# Patient Record
Sex: Female | Born: 1950
Health system: Southern US, Community
[De-identification: ages and names within clinical notes are randomized; demographics above are authoritative.]

## PROBLEM LIST (undated history)

## (undated) DIAGNOSIS — M8580 Other specified disorders of bone density and structure, unspecified site: Secondary | ICD-10-CM

## (undated) DIAGNOSIS — M7051 Other bursitis of knee, right knee: Secondary | ICD-10-CM

## (undated) DIAGNOSIS — E669 Obesity, unspecified: Secondary | ICD-10-CM

## (undated) DIAGNOSIS — I10 Essential (primary) hypertension: Secondary | ICD-10-CM

## (undated) HISTORY — PX: CHOLECYSTECTOMY: SHX55

## (undated) HISTORY — PX: ABDOMINAL HYSTERECTOMY: SHX81

---

## 1997-10-01 ENCOUNTER — Ambulatory Visit (HOSPITAL_COMMUNITY): Admission: RE | Admit: 1997-10-01 | Discharge: 1997-10-01 | Payer: Self-pay | Admitting: Family Medicine

## 1997-10-02 ENCOUNTER — Ambulatory Visit (HOSPITAL_COMMUNITY): Admission: RE | Admit: 1997-10-02 | Discharge: 1997-10-02 | Payer: Self-pay

## 1997-11-04 ENCOUNTER — Ambulatory Visit (HOSPITAL_COMMUNITY): Admission: RE | Admit: 1997-11-04 | Discharge: 1997-11-04 | Payer: Self-pay | Admitting: Urology

## 2000-09-06 ENCOUNTER — Encounter: Admission: RE | Admit: 2000-09-06 | Discharge: 2000-09-06 | Payer: Self-pay | Admitting: Internal Medicine

## 2000-09-06 ENCOUNTER — Encounter: Payer: Self-pay | Admitting: Internal Medicine

## 2000-10-18 ENCOUNTER — Other Ambulatory Visit: Admission: RE | Admit: 2000-10-18 | Discharge: 2000-10-18 | Payer: Self-pay | Admitting: Obstetrics & Gynecology

## 2000-10-18 ENCOUNTER — Encounter: Admission: RE | Admit: 2000-10-18 | Discharge: 2000-10-18 | Payer: Self-pay | Admitting: Obstetrics & Gynecology

## 2000-10-27 ENCOUNTER — Ambulatory Visit (HOSPITAL_COMMUNITY): Admission: RE | Admit: 2000-10-27 | Discharge: 2000-10-27 | Payer: Self-pay | Admitting: *Deleted

## 2001-06-15 ENCOUNTER — Encounter: Payer: Self-pay | Admitting: Family Medicine

## 2001-06-15 ENCOUNTER — Encounter: Admission: RE | Admit: 2001-06-15 | Discharge: 2001-06-15 | Payer: Self-pay | Admitting: Family Medicine

## 2002-12-25 ENCOUNTER — Other Ambulatory Visit: Admission: RE | Admit: 2002-12-25 | Discharge: 2002-12-25 | Payer: Self-pay | Admitting: Obstetrics and Gynecology

## 2003-01-01 ENCOUNTER — Inpatient Hospital Stay (HOSPITAL_COMMUNITY): Admission: RE | Admit: 2003-01-01 | Discharge: 2003-01-03 | Payer: Self-pay | Admitting: Obstetrics and Gynecology

## 2003-01-01 ENCOUNTER — Encounter (INDEPENDENT_AMBULATORY_CARE_PROVIDER_SITE_OTHER): Payer: Self-pay | Admitting: Specialist

## 2003-01-15 ENCOUNTER — Encounter: Admission: RE | Admit: 2003-01-15 | Discharge: 2003-01-15 | Payer: Self-pay | Admitting: Obstetrics and Gynecology

## 2003-01-31 ENCOUNTER — Encounter: Admission: RE | Admit: 2003-01-31 | Discharge: 2003-01-31 | Payer: Self-pay | Admitting: Obstetrics and Gynecology

## 2007-07-07 ENCOUNTER — Encounter: Admission: RE | Admit: 2007-07-07 | Discharge: 2007-07-07 | Payer: Self-pay | Admitting: Family Medicine

## 2010-02-02 ENCOUNTER — Ambulatory Visit: Payer: Self-pay | Admitting: Internal Medicine

## 2010-03-15 ENCOUNTER — Encounter: Payer: Self-pay | Admitting: Family Medicine

## 2010-03-26 NOTE — Assessment & Plan Note (Signed)
Summary: FLU SHOT/EVM  Nurse Visit   Immunizations Administered:  Influenza Vaccine:    Vaccine Type: Flulaval    Site: left deltoid    Mfr: GlaxoSmithKline    Dose: 0.5 ml    Route: IM    Given by: Levonne Spiller EMT-P    Exp. Date: 08/22/2010    Lot #: [id number]    VIS given: 09/16/09 version given February 02, 2010.    Immunizations Administered:  Influenza Vaccine:    Vaccine Type: Flulaval    Site: left deltoid    Mfr: GlaxoSmithKline    Dose: 0.5 ml    Route: IM    Given by: Levonne Spiller EMT-P    Exp. Date: 08/22/2010    Lot #: [id number]    VIS given: 09/16/09 version given February 02, 2010.  Flu Vaccine Consent Questions:    Do you have a history of severe allergic reactions to this vaccine? no    Any prior history of allergic reactions to egg and/or gelatin? no    Do you have a sensitivity to the preservative Thimersol? no    Do you have a past history of Guillan-Barre Syndrome? no    Do you currently have an acute febrile illness? no    Have you ever had a severe reaction to latex? no    Vaccine information given and explained to patient? yes    Are you currently pregnant? no

## 2010-07-10 NOTE — Op Note (Signed)
NAME:  Julie Stout, Julie Stout                     ACCOUNT NO.:  [id number]   MEDICAL RECORD NO.:  [id number]                   PATIENT TYPE:  INP   LOCATION:  9199                                 FACILITY:  [facility]   PHYSICIAN:  Laqueta Linden, M.D.                 DATE OF BIRTH:  10/04/1950   DATE OF PROCEDURE:  01/01/2003  DATE OF DISCHARGE:                                 OPERATIVE REPORT   PREOPERATIVE DIAGNOSIS:  Large symptomatic leiomyomata uteri.   POSTOPERATIVE DIAGNOSIS:  Large symptomatic leiomyomata uteri.   PROCEDURES:  1. Total abdominal hysterectomy, bilateral salpingo-oophorectomy.  2. Placement of On-Q catheters x2.   SURGEON:  Laqueta Linden, M.D.   ASSISTANT:  Andres Ege, M.D.   ANESTHESIA:  General endotracheal.   ESTIMATED BLOOD LOSS:  200 mL.   URINE OUTPUT:  225 mL.   FLUIDS REPLACED:  2 L crystalloid.   COUNTS:  Correct x2.   COMPLICATIONS:  None.   INDICATIONS:  Julie Stout is a 60 year old gravida 2, para 2,  perimenopausal female with irregular and heavy periods and gradually  enlarging, increasingly symptomatic fibroids, who desires definitive  surgical management.  She is noted to have an 18-20 week size uterus on  examination.  Ultrasound confirms presence of large fibroids with a septate  cyst involving the right ovary.  Endometrial biopsy was benign.  She saw the  informed consent film, voiced her understanding and acceptance of all risks,  including but not limited to anesthesia risk, infection, bleeding possibly  requiring transfusion, injury to bowel, bladder, ureters, vessels, nerves,  possibility of fistula formation, other postoperative complications  including DVT, PE, pneumonia, death, and other unnamed risks, as well as  menopausal symptoms resulting from removal of her ovaries and management  thereof, as well as postoperative return to normal activity and sexual  functioning.  She voiced her understanding and acceptance of  all risks, had  her questions answered, and agrees to proceed.  She has received Ancef 1 g  IV antibiotic prophylaxis preoperatively.   DESCRIPTION OF PROCEDURE:  The patient was taken to the operating room and  after proper identification and consents were ascertained, she was placed on  the operating table in the supine position.  Anesthesia had a fair bit of  trouble intubating this patient and finally used the fiberoptic scope to  pass the endotracheal tube.  Specifics will be detailed in a note per  anesthesia.  After the patient was adequately intubated and ventilated, she  was then placed in the frogleg position and the abdomen, perineum, and  vagina were prepped and draped in a routine sterile fashion.  A  transurethral Foley was placed.  A transverse incision was made just above  the symphysis and carried down to the level of the anterior rectus fascia.  The fascia was incised and the incision extended laterally superiorly and  inferiorly.  The rectus muscles were  separated, parietal peritoneum was  incised, and the incision was extended superiorly and inferiorly.  Palpation  of the upper abdomen revealed smooth renal contours bilaterally.  The  patient is status post cholecystectomy.  The appendix was normal in  appearance without evidence of adhesions or appendicoliths.  The uterus was  noted to be grossly distorted by multiple large fibroids.  The left tube and  ovary appeared normal.  The right ovary had a large benign-appearing cyst,  which actually ruptured during the manipulation of the uterus.  A self-  retaining retractor was placed.  Round ligaments were clamped, cut, and  suture ligated with 0 Vicryl suture.  Dissection was carried forward  anteriorly and posteriorly in the broad ligament.  The infundibulopelvic  ligaments were then isolated bilaterally with the course of the ureter deep  in the pelvis while out of the operative field.  The infundibulopelvic  ligaments  were then clamped, cut, and doubly ligated with a free tie and a  stitch of 0 Vicryl.  Uterine vessels were then skeletonized and curved  Heaney clamps placed across the uterine vessels perpendicularly at the level  of the internal os.  This was accomplished after the bladder was dissected  further off the lower uterine segment and cervix.  After the uterine vessels  were ligated, an additional small pedicle was taken at the top of the  cardinal ligament, and that ligature included the previous one such that the  uterine vessels were doubly ligated.  At this point the uterus with attached  tubes and ovaries was transected from the upper cervix to improve  visualization.  The specimen was passed off the table to go for final  sectioning.  The lap packs and the self-retaining retractor were then  readjusted with marked improvement in visualization of the lower pelvis.  Straight Heaney clamps were placed across the cardinal ligaments bilaterally  to the level of the upper vaginal angles.  Curved Heaney clamps were then  placed with the pedicles suture ligated and tagged, including the  uterosacral ligaments.  The cervix was then circumscribed with a scalpel and  passed off the table to go with the final specimen.  A Richardson angle  suture was placed at both vaginal angles with excellent hemostasis noted.  The remainder of the vaginal cuff was closed with figure-of-eight sutures of  0 Vicryl from front to back.  The uterosacral ligaments were plicated  posteriorly to decrease enterocele formation.  Copious lavage was then  accomplished.  All pedicles were hemostatic.  Several small bleeding points  were cauterized.  The bladder flap was hemostatic.  Both ureters were of  normal course and caliber and peristalsing normally at the conclusion of the  procedure.  All lap packs and the retractor were then removed.  Counts were correct prior to closure of the abdomen.  The parietal peritoneum was  closed  in a running fashion using 2-0 Vicryl suture.  The rectus muscles were  loosely reapproximated in the midline.  An On-Q catheter was then placed  from the left midquadrant into the subfascial plane and steri-stripped and  then with OpSite placed to hold it in place.  The fascia was then closed  from both lateral aspects to the midline using a running stitch of 0 Maxon.  This catheter was then loaded with 10 mL of 1% plain Xylocaine.  Subcutaneous hemostasis was ascertained.  A second On-Q catheter was then  placed through the right midquadrant into the subcutaneous tissues.  This  was also steri-stripped and OpSite was placed to hold it in place.  The skin  was then closed with a subcuticular suture of 4-0 Dexon using a Keith  needle.  Steri-Strips and pressure dressing were applied.  The subcutaneous  catheter was bolused with 5 mL of 1% plain lidocaine.  The catheters were  then hooked up to a bulb containing 0.5% plain Marcaine running at a rate of  2 mL/hr. per bulb.  The patient also received Toradol 30 mg IV and 30 mg IM  intraoperatively.  She was stable and extubated on transfer to the PACU.  She will  be observed and discharged to the floor per anesthesia.  They will speak  with the patient regarding the difficulty with the intubation.  She was  given one dose of Decadron to decrease laryngeal edema from the potential  trauma of the repeated intubation attempts.  The patient was stable on  transfer to the PACU.                                               Laqueta Linden, M.D.    LKS/MEDQ  D:  01/01/2003  T:  01/01/2003  Job:  [id number]   cc:   Duncan Dull, M.D.  [address]  [place]  [place] [address]  Fax: [phone]

## 2013-03-16 ENCOUNTER — Other Ambulatory Visit: Payer: Self-pay | Admitting: Family Medicine

## 2013-03-16 DIAGNOSIS — Z780 Asymptomatic menopausal state: Secondary | ICD-10-CM

## 2013-03-16 DIAGNOSIS — Z1231 Encounter for screening mammogram for malignant neoplasm of breast: Secondary | ICD-10-CM

## 2013-04-12 ENCOUNTER — Other Ambulatory Visit: Payer: Self-pay

## 2013-04-12 ENCOUNTER — Ambulatory Visit: Payer: Self-pay

## 2013-05-04 ENCOUNTER — Ambulatory Visit
Admission: RE | Admit: 2013-05-04 | Discharge: 2013-05-04 | Disposition: A | Discharge status: Home / Self Care | Payer: BC Managed Care – PPO | Source: Ambulatory Visit | Attending: Family Medicine | Admitting: Family Medicine

## 2013-05-04 DIAGNOSIS — Z780 Asymptomatic menopausal state: Secondary | ICD-10-CM

## 2013-05-04 DIAGNOSIS — Z1231 Encounter for screening mammogram for malignant neoplasm of breast: Secondary | ICD-10-CM

## 2014-06-26 ENCOUNTER — Other Ambulatory Visit: Payer: Self-pay

## 2014-06-26 DIAGNOSIS — Z1231 Encounter for screening mammogram for malignant neoplasm of breast: Secondary | ICD-10-CM

## 2014-07-05 ENCOUNTER — Ambulatory Visit
Admission: RE | Admit: 2014-07-05 | Discharge: 2014-07-05 | Disposition: A | Discharge status: Home / Self Care | Payer: BLUE CROSS/BLUE SHIELD | Source: Ambulatory Visit

## 2014-07-05 DIAGNOSIS — Z1231 Encounter for screening mammogram for malignant neoplasm of breast: Secondary | ICD-10-CM

## 2014-08-19 ENCOUNTER — Ambulatory Visit: Payer: BLUE CROSS/BLUE SHIELD | Admitting: Anesthesiology

## 2014-08-19 ENCOUNTER — Encounter
Admission: RE | Disposition: A | Discharge status: Home / Self Care | Payer: Self-pay | Source: Ambulatory Visit | Attending: Unknown Physician Specialty

## 2014-08-19 ENCOUNTER — Ambulatory Visit
Admission: RE | Admit: 2014-08-19 | Discharge: 2014-08-19 | Disposition: A | Discharge status: Home / Self Care | Payer: BLUE CROSS/BLUE SHIELD | Source: Ambulatory Visit | Attending: Unknown Physician Specialty | Admitting: Unknown Physician Specialty

## 2014-08-19 DIAGNOSIS — Z1211 Encounter for screening for malignant neoplasm of colon: Secondary | ICD-10-CM | POA: Insufficient documentation

## 2014-08-19 DIAGNOSIS — Z791 Long term (current) use of non-steroidal anti-inflammatories (NSAID): Secondary | ICD-10-CM | POA: Diagnosis not present

## 2014-08-19 DIAGNOSIS — K640 First degree hemorrhoids: Secondary | ICD-10-CM | POA: Diagnosis not present

## 2014-08-19 DIAGNOSIS — K5730 Diverticulosis of large intestine without perforation or abscess without bleeding: Secondary | ICD-10-CM | POA: Insufficient documentation

## 2014-08-19 DIAGNOSIS — D125 Benign neoplasm of sigmoid colon: Secondary | ICD-10-CM | POA: Insufficient documentation

## 2014-08-19 DIAGNOSIS — Z79899 Other long term (current) drug therapy: Secondary | ICD-10-CM | POA: Insufficient documentation

## 2014-08-19 HISTORY — PX: COLONOSCOPY WITH PROPOFOL: SHX5780

## 2014-08-19 SURGERY — COLONOSCOPY WITH PROPOFOL
Anesthesia: General

## 2014-08-19 MED ORDER — LIDOCAINE HCL (PF) 1 % IJ SOLN
INTRAMUSCULAR | Status: AC
  Administered 2014-08-19: 0.3 mL via INTRADERMAL
  Filled 2014-08-19: qty 2

## 2014-08-19 MED ORDER — SODIUM CHLORIDE 0.9 % IV SOLN
INTRAVENOUS | Status: DC
  Administered 2014-08-19: 1000 mL via INTRAVENOUS

## 2014-08-19 MED ORDER — PROPOFOL INFUSION 10 MG/ML OPTIME
INTRAVENOUS | Status: DC | PRN
  Administered 2014-08-19: 100 ug/kg/min via INTRAVENOUS

## 2014-08-19 MED ORDER — LIDOCAINE HCL (PF) 1 % IJ SOLN
2.0000 mL | Freq: Once | INTRAMUSCULAR | Status: AC
  Administered 2014-08-19: 0.3 mL via INTRADERMAL

## 2014-08-19 MED ORDER — LIDOCAINE HCL (PF) 2 % IJ SOLN
INTRAMUSCULAR | Status: DC | PRN
  Administered 2014-08-19: 50 mg

## 2014-08-19 MED ORDER — FENTANYL CITRATE (PF) 100 MCG/2ML IJ SOLN
INTRAMUSCULAR | Status: DC | PRN
  Administered 2014-08-19: 50 ug via INTRAVENOUS

## 2014-08-19 MED ORDER — PROPOFOL 10 MG/ML IV BOLUS
INTRAVENOUS | Status: DC | PRN
  Administered 2014-08-19: 10 mg via INTRAVENOUS
  Administered 2014-08-19: 50 mg via INTRAVENOUS

## 2014-08-19 MED ORDER — MIDAZOLAM HCL 5 MG/5ML IJ SOLN
INTRAMUSCULAR | Status: DC | PRN
  Administered 2014-08-19: 1 mg via INTRAVENOUS

## 2014-08-19 NOTE — Op Note (Signed)
[facility] Gastroenterology Patient Name: Julie Stout Procedure Date: 08/19/2014 1:26 PM MRN: [id number] Account #: [id number] Date of Birth: 1950-03-17 Admit Type: Outpatient Age: 64 Room: [facility] ENDO ROOM 1 Gender: Female Note Status: Finalized Procedure:         Colonoscopy Indications:       Screening for colorectal malignant neoplasm Providers:         Manya Silvas, MD Referring MD:      Darcus Austin, MD (Referring MD) Medicines:         Propofol per Anesthesia Complications:     No immediate complications. Procedure:         Pre-Anesthesia Assessment:                    - After reviewing the risks and benefits, the patient was                     deemed in satisfactory condition to undergo the procedure.                    After obtaining informed consent, the colonoscope was                     passed under direct vision. Throughout the procedure, the                     patient's blood pressure, pulse, and oxygen saturations                     were monitored continuously. The Olympus Colonoscope                     PCF-160AL (S# [id number]) was introduced through the anus and                     advanced to the the cecum, identified by appendiceal                     orifice and ileocecal valve. The colonoscopy was performed                     without difficulty. The patient tolerated the procedure                     well. The quality of the bowel preparation was excellent. Findings:      Many medium-mouthed diverticula were found in the sigmoid colon and in       the descending colon.      Internal hemorrhoids were found during endoscopy. The hemorrhoids were       small, medium-sized and Grade I (internal hemorrhoids that do not       prolapse). Anal papiliae seen.      A 10 mm polyp was found in the descending colon. The polyp was sessile.       The polyp was removed with a hot snare. Resection and retrieval were       complete.      The  exam was otherwise without abnormality. Impression:        - Diverticulosis in the sigmoid colon and in the                     descending colon.                    -  Internal hemorrhoids.                    - One 10 mm polyp in the descending colon. Resected and                     retrieved.                    - The examination was otherwise normal. Recommendation:    - Await pathology results. Manya Silvas, MD 08/19/2014 2:09:38 PM This report has been signed electronically. Number of Addenda: 0 Note Initiated On: 08/19/2014 1:26 PM Scope Withdrawal Time: 0 hours 19 minutes 56 seconds  Total Procedure Duration: 0 hours 30 minutes 20 seconds       [facility]

## 2014-08-19 NOTE — Anesthesia Preprocedure Evaluation (Signed)
Anesthesia Evaluation  Patient identified by MRN, date of birth, ID band Patient awake    Reviewed: Allergy & Precautions, H&P , NPO status , Patient's Chart, lab work & pertinent test results, reviewed documented beta blocker date and time   Airway Mallampati: II  TM Distance: >3 FB Neck ROM: full    Dental no notable dental hx.    Pulmonary neg pulmonary ROS,  breath sounds clear to auscultation  Pulmonary exam normal       Cardiovascular Exercise Tolerance: Good negative cardio ROS  Rhythm:regular Rate:Normal     Neuro/Psych negative neurological ROS  negative psych ROS   GI/Hepatic negative GI ROS, Neg liver ROS,   Endo/Other  negative endocrine ROS  Renal/GU negative Renal ROS  negative genitourinary   Musculoskeletal   Abdominal   Peds  Hematology negative hematology ROS (+)   Anesthesia Other Findings   Reproductive/Obstetrics negative OB ROS                             Anesthesia Physical Anesthesia Plan  ASA: II  Anesthesia Plan: General   Post-op Pain Management:    Induction:   Airway Management Planned:   Additional Equipment:   Intra-op Plan:   Post-operative Plan:   Informed Consent: I have reviewed the patients History and Physical, chart, labs and discussed the procedure including the risks, benefits and alternatives for the proposed anesthesia with the patient or authorized representative who has indicated his/her understanding and acceptance.   Dental Advisory Given  Plan Discussed with: CRNA  Anesthesia Plan Comments:         Anesthesia Quick Evaluation

## 2014-08-19 NOTE — H&P (Signed)
Primary Care Physician:  Marjorie Smolder, MD Primary Gastroenterologist:  Dr. Vira Agar  Pre-Procedure History & Physical: HPI:  Julie Stout is a 64 y.o. female is here for an colonoscopy.   No past medical history on file.  No past surgical history on file.  Prior to Admission medications   Medication Sig Start Date End Date Taking? Authorizing Provider  cholecalciferol (VITAMIN D) 400 UNITS TABS tablet Take 400 Units by mouth.   Yes Historical Provider, MD  ibuprofen (ADVIL,MOTRIN) 800 MG tablet Take 800 mg by mouth every 8 (eight) hours as needed.   Yes Historical Provider, MD  polyethylene glycol powder (GLYCOLAX/MIRALAX) powder Take as directed for colonoscopy prep. 07/04/14  Yes Historical Provider, MD  hydrochlorothiazide (MICROZIDE) 12.5 MG capsule Take by mouth.    Historical Provider, MD  lisinopril (PRINIVIL,ZESTRIL) 20 MG tablet Take by mouth.    Historical Provider, MD    Allergies as of 07/05/2014  . (Not on File)    No family history on file.  History   Social History  . Marital Status: Widowed    Spouse Name: N/A  . Number of Children: N/A  . Years of Education: N/A   Occupational History  . Not on file.   Social History Main Topics  . Smoking status: Not on file  . Smokeless tobacco: Not on file  . Alcohol Use: Not on file  . Drug Use: Not on file  . Sexual Activity: Not on file   Other Topics Concern  . Not on file   Social History Narrative  . No narrative on file    Review of Systems: See HPI, otherwise negative ROS  Physical Exam: BP 152/95 mmHg  Pulse 80  Temp(Src) 96.7 F (35.9 C) (Tympanic)  Resp 17  Ht 5\' 1"  (1.549 m)  Wt 90.719 kg (200 lb)  BMI 37.81 kg/m2  SpO2 100% General:   Alert,  pleasant and cooperative in NAD Head:  Normocephalic and atraumatic. Neck:  Supple; no masses or thyromegaly. Lungs:  Clear throughout to auscultation.    Heart:  Regular rate and rhythm. Abdomen:  Soft, nontender and nondistended.  Normal bowel sounds, without guarding, and without rebound.   Neurologic:  Alert and  oriented x4;  grossly normal neurologically.  Impression/Plan: Julie Stout is here for an colonoscopy to be performed for screening colonoscopy  Risks, benefits, limitations, and alternatives regarding  colonoscopy have been reviewed with the patient.  Questions have been answered.  All parties agreeable.   Julie Cheers, MD  08/19/2014, 2:11 PM   Primary Care Physician:  Marjorie Smolder, MD Primary Gastroenterologist:  Dr. Vira Agar  Pre-Procedure History & Physical: HPI:  Julie Stout is a 64 y.o. female is here for an colonoscopy.   No past medical history on file.  No past surgical history on file.  Prior to Admission medications   Medication Sig Start Date End Date Taking? Authorizing Provider  cholecalciferol (VITAMIN D) 400 UNITS TABS tablet Take 400 Units by mouth.   Yes Historical Provider, MD  ibuprofen (ADVIL,MOTRIN) 800 MG tablet Take 800 mg by mouth every 8 (eight) hours as needed.   Yes Historical Provider, MD  polyethylene glycol powder (GLYCOLAX/MIRALAX) powder Take as directed for colonoscopy prep. 07/04/14  Yes Historical Provider, MD  hydrochlorothiazide (MICROZIDE) 12.5 MG capsule Take by mouth.    Historical Provider, MD  lisinopril (PRINIVIL,ZESTRIL) 20 MG tablet Take by mouth.    Historical Provider, MD    Allergies as of 07/05/2014  . (  Not on File)    No family history on file.  History   Social History  . Marital Status: Widowed    Spouse Name: N/A  . Number of Children: N/A  . Years of Education: N/A   Occupational History  . Not on file.   Social History Main Topics  . Smoking status: Not on file  . Smokeless tobacco: Not on file  . Alcohol Use: Not on file  . Drug Use: Not on file  . Sexual Activity: Not on file   Other Topics Concern  . Not on file   Social History Narrative  . No narrative on file    Review of Systems: See  HPI, otherwise negative ROS  Physical Exam: BP 152/95 mmHg  Pulse 80  Temp(Src) 96.7 F (35.9 C) (Tympanic)  Resp 17  Ht 5\' 1"  (1.549 m)  Wt 90.719 kg (200 lb)  BMI 37.81 kg/m2  SpO2 100% General:   Alert,  pleasant and cooperative in NAD Head:  Normocephalic and atraumatic. Neck:  Supple; no masses or thyromegaly. Lungs:  Clear throughout to auscultation.    Heart:  Regular rate and rhythm. Abdomen:  Soft, nontender and nondistended. Normal bowel sounds, without guarding, and without rebound.   Neurologic:  Alert and  oriented x4;  grossly normal neurologically.  Impression/Plan: Julie Stout is here for an colonoscopy to be performed for screening colon  Risks, benefits, limitations, and alternatives regarding  colonoscopy have been reviewed with the patient.  Questions have been answered.  All parties agreeable.   Julie Cheers, MD  08/19/2014, 2:11 PM

## 2014-08-19 NOTE — Transfer of Care (Signed)
Immediate Anesthesia Transfer of Care Note  Patient: Julie Stout  Procedure(s) Performed: Procedure(s): COLONOSCOPY WITH PROPOFOL (N/A)  Patient Location: PACU  Anesthesia Type:General  Level of Consciousness: awake  Airway & Oxygen Therapy: Patient Spontanous Breathing and Patient connected to nasal cannula oxygen  Post-op Assessment: Report given to RN and Post -op Vital signs reviewed and stable  Post vital signs: Reviewed and stable  Last Vitals:  Filed Vitals:   08/19/14 1308  BP: 152/95  Pulse: 80  Temp: 35.9 C  Resp: 17    Complications: No apparent anesthesia complications

## 2014-08-20 NOTE — Anesthesia Postprocedure Evaluation (Signed)
  Anesthesia Post-op Note  Patient: Julie Stout  Procedure(s) Performed: Procedure(s): COLONOSCOPY WITH PROPOFOL (N/A)  Anesthesia type:General  Patient location: PACU  Post pain: Pain level controlled  Post assessment: Post-op Vital signs reviewed, Patient's Cardiovascular Status Stable, Respiratory Function Stable, Patent Airway and No signs of Nausea or vomiting  Post vital signs: Reviewed and stable  Last Vitals:  Filed Vitals:   08/19/14 1450  BP: 99/51  Pulse: 72  Temp:   Resp: 13    Level of consciousness: awake, alert  and patient cooperative  Complications: No apparent anesthesia complications

## 2014-08-21 ENCOUNTER — Encounter: Payer: Self-pay | Admitting: Unknown Physician Specialty

## 2014-08-21 LAB — SURGICAL PATHOLOGY

## 2015-05-19 DIAGNOSIS — E559 Vitamin D deficiency, unspecified: Secondary | ICD-10-CM | POA: Diagnosis not present

## 2015-05-19 DIAGNOSIS — Z23 Encounter for immunization: Secondary | ICD-10-CM | POA: Diagnosis not present

## 2015-05-19 DIAGNOSIS — E7800 Pure hypercholesterolemia, unspecified: Secondary | ICD-10-CM | POA: Diagnosis not present

## 2015-05-19 DIAGNOSIS — R739 Hyperglycemia, unspecified: Secondary | ICD-10-CM | POA: Diagnosis not present

## 2015-05-19 DIAGNOSIS — I10 Essential (primary) hypertension: Secondary | ICD-10-CM | POA: Diagnosis not present

## 2015-05-19 DIAGNOSIS — Z0000 Encounter for general adult medical examination without abnormal findings: Secondary | ICD-10-CM | POA: Diagnosis not present

## 2015-06-24 DIAGNOSIS — E876 Hypokalemia: Secondary | ICD-10-CM | POA: Diagnosis not present

## 2015-08-25 DIAGNOSIS — E669 Obesity, unspecified: Secondary | ICD-10-CM | POA: Diagnosis not present

## 2015-08-25 DIAGNOSIS — R7303 Prediabetes: Secondary | ICD-10-CM | POA: Diagnosis not present

## 2015-11-20 DIAGNOSIS — R7303 Prediabetes: Secondary | ICD-10-CM | POA: Diagnosis not present

## 2015-11-20 DIAGNOSIS — Z23 Encounter for immunization: Secondary | ICD-10-CM | POA: Diagnosis not present

## 2015-11-20 DIAGNOSIS — I10 Essential (primary) hypertension: Secondary | ICD-10-CM | POA: Diagnosis not present

## 2016-05-31 DIAGNOSIS — Z0000 Encounter for general adult medical examination without abnormal findings: Secondary | ICD-10-CM | POA: Diagnosis not present

## 2016-05-31 DIAGNOSIS — I10 Essential (primary) hypertension: Secondary | ICD-10-CM | POA: Diagnosis not present

## 2016-05-31 DIAGNOSIS — E7800 Pure hypercholesterolemia, unspecified: Secondary | ICD-10-CM | POA: Diagnosis not present

## 2016-05-31 DIAGNOSIS — R7303 Prediabetes: Secondary | ICD-10-CM | POA: Diagnosis not present

## 2016-05-31 DIAGNOSIS — E559 Vitamin D deficiency, unspecified: Secondary | ICD-10-CM | POA: Diagnosis not present

## 2017-07-11 DIAGNOSIS — M8580 Other specified disorders of bone density and structure, unspecified site: Secondary | ICD-10-CM | POA: Diagnosis not present

## 2017-07-11 DIAGNOSIS — E559 Vitamin D deficiency, unspecified: Secondary | ICD-10-CM | POA: Diagnosis not present

## 2017-07-11 DIAGNOSIS — E7800 Pure hypercholesterolemia, unspecified: Secondary | ICD-10-CM | POA: Diagnosis not present

## 2017-07-11 DIAGNOSIS — Z0000 Encounter for general adult medical examination without abnormal findings: Secondary | ICD-10-CM | POA: Diagnosis not present

## 2017-07-11 DIAGNOSIS — R7309 Other abnormal glucose: Secondary | ICD-10-CM | POA: Diagnosis not present

## 2017-07-11 DIAGNOSIS — I10 Essential (primary) hypertension: Secondary | ICD-10-CM | POA: Diagnosis not present

## 2017-07-11 DIAGNOSIS — Z1159 Encounter for screening for other viral diseases: Secondary | ICD-10-CM | POA: Diagnosis not present

## 2017-07-12 ENCOUNTER — Other Ambulatory Visit: Payer: Self-pay | Admitting: Family Medicine

## 2017-07-12 DIAGNOSIS — M8580 Other specified disorders of bone density and structure, unspecified site: Secondary | ICD-10-CM

## 2017-07-12 DIAGNOSIS — Z139 Encounter for screening, unspecified: Secondary | ICD-10-CM

## 2017-07-19 ENCOUNTER — Other Ambulatory Visit: Payer: Self-pay | Admitting: Family Medicine

## 2017-11-17 DIAGNOSIS — Z86010 Personal history of colonic polyps: Secondary | ICD-10-CM | POA: Diagnosis not present

## 2018-01-18 DIAGNOSIS — I10 Essential (primary) hypertension: Secondary | ICD-10-CM | POA: Diagnosis not present

## 2018-01-18 DIAGNOSIS — E7800 Pure hypercholesterolemia, unspecified: Secondary | ICD-10-CM | POA: Diagnosis not present

## 2018-01-18 DIAGNOSIS — Z6837 Body mass index (BMI) 37.0-37.9, adult: Secondary | ICD-10-CM | POA: Diagnosis not present

## 2018-01-18 DIAGNOSIS — N183 Chronic kidney disease, stage 3 (moderate): Secondary | ICD-10-CM | POA: Diagnosis not present

## 2018-01-18 DIAGNOSIS — R7303 Prediabetes: Secondary | ICD-10-CM | POA: Diagnosis not present

## 2018-01-24 ENCOUNTER — Ambulatory Visit
Admission: RE | Admit: 2018-01-24 | Discharge: 2018-01-24 | Disposition: A | Discharge status: Home / Self Care | Payer: PPO | Source: Ambulatory Visit | Attending: Family Medicine | Admitting: Family Medicine

## 2018-01-24 DIAGNOSIS — Z139 Encounter for screening, unspecified: Secondary | ICD-10-CM

## 2018-01-24 DIAGNOSIS — Z1231 Encounter for screening mammogram for malignant neoplasm of breast: Secondary | ICD-10-CM | POA: Diagnosis not present

## 2018-01-31 ENCOUNTER — Encounter: Payer: Self-pay | Admitting: *Deleted

## 2018-02-01 ENCOUNTER — Encounter: Payer: Self-pay | Admitting: *Deleted

## 2018-02-01 ENCOUNTER — Ambulatory Visit: Payer: PPO | Admitting: Anesthesiology

## 2018-02-01 ENCOUNTER — Ambulatory Visit
Admission: RE | Admit: 2018-02-01 | Discharge: 2018-02-01 | Disposition: A | Discharge status: Home / Self Care | Payer: PPO | Attending: Unknown Physician Specialty | Admitting: Unknown Physician Specialty

## 2018-02-01 ENCOUNTER — Encounter
Admission: RE | Disposition: A | Discharge status: Home / Self Care | Payer: Self-pay | Source: Home / Self Care | Attending: Unknown Physician Specialty

## 2018-02-01 DIAGNOSIS — D128 Benign neoplasm of rectum: Secondary | ICD-10-CM | POA: Insufficient documentation

## 2018-02-01 DIAGNOSIS — K640 First degree hemorrhoids: Secondary | ICD-10-CM | POA: Diagnosis not present

## 2018-02-01 DIAGNOSIS — K5790 Diverticulosis of intestine, part unspecified, without perforation or abscess without bleeding: Secondary | ICD-10-CM | POA: Diagnosis not present

## 2018-02-01 DIAGNOSIS — Z1211 Encounter for screening for malignant neoplasm of colon: Secondary | ICD-10-CM | POA: Insufficient documentation

## 2018-02-01 DIAGNOSIS — K5730 Diverticulosis of large intestine without perforation or abscess without bleeding: Secondary | ICD-10-CM | POA: Diagnosis not present

## 2018-02-01 DIAGNOSIS — M8580 Other specified disorders of bone density and structure, unspecified site: Secondary | ICD-10-CM | POA: Diagnosis not present

## 2018-02-01 DIAGNOSIS — I10 Essential (primary) hypertension: Secondary | ICD-10-CM | POA: Diagnosis not present

## 2018-02-01 DIAGNOSIS — D126 Benign neoplasm of colon, unspecified: Secondary | ICD-10-CM | POA: Diagnosis not present

## 2018-02-01 DIAGNOSIS — K648 Other hemorrhoids: Secondary | ICD-10-CM | POA: Diagnosis not present

## 2018-02-01 DIAGNOSIS — Z79899 Other long term (current) drug therapy: Secondary | ICD-10-CM | POA: Insufficient documentation

## 2018-02-01 DIAGNOSIS — E669 Obesity, unspecified: Secondary | ICD-10-CM | POA: Diagnosis not present

## 2018-02-01 DIAGNOSIS — K635 Polyp of colon: Secondary | ICD-10-CM | POA: Diagnosis not present

## 2018-02-01 DIAGNOSIS — Z6834 Body mass index (BMI) 34.0-34.9, adult: Secondary | ICD-10-CM | POA: Diagnosis not present

## 2018-02-01 DIAGNOSIS — Z86010 Personal history of colonic polyps: Secondary | ICD-10-CM | POA: Diagnosis not present

## 2018-02-01 HISTORY — DX: Other bursitis of knee, right knee: M70.51

## 2018-02-01 HISTORY — DX: Essential (primary) hypertension: I10

## 2018-02-01 HISTORY — DX: Other specified disorders of bone density and structure, unspecified site: M85.80

## 2018-02-01 HISTORY — PX: COLONOSCOPY WITH PROPOFOL: SHX5780

## 2018-02-01 HISTORY — DX: Obesity, unspecified: E66.9

## 2018-02-01 SURGERY — COLONOSCOPY WITH PROPOFOL
Anesthesia: General

## 2018-02-01 MED ORDER — EPHEDRINE SULFATE 50 MG/ML IJ SOLN
INTRAMUSCULAR | Status: DC | PRN
  Administered 2018-02-01: 10 mg via INTRAVENOUS

## 2018-02-01 MED ORDER — PROPOFOL 10 MG/ML IV BOLUS
INTRAVENOUS | Status: DC | PRN
  Administered 2018-02-01: 80 mg via INTRAVENOUS

## 2018-02-01 MED ORDER — PHENYLEPHRINE HCL 10 MG/ML IJ SOLN
INTRAMUSCULAR | Status: DC | PRN
  Administered 2018-02-01: 100 ug via INTRAVENOUS

## 2018-02-01 MED ORDER — LIDOCAINE 2% (20 MG/ML) 5 ML SYRINGE
INTRAMUSCULAR | Status: DC | PRN
  Administered 2018-02-01: 30 mg via INTRAVENOUS

## 2018-02-01 MED ORDER — PROPOFOL 500 MG/50ML IV EMUL
INTRAVENOUS | Status: AC
  Filled 2018-02-01: qty 50

## 2018-02-01 MED ORDER — SODIUM CHLORIDE 0.9 % IV SOLN
INTRAVENOUS | Status: DC
  Administered 2018-02-01: 12:00:00 via INTRAVENOUS

## 2018-02-01 MED ORDER — FENTANYL CITRATE (PF) 100 MCG/2ML IJ SOLN
INTRAMUSCULAR | Status: AC
  Filled 2018-02-01: qty 2

## 2018-02-01 MED ORDER — SODIUM CHLORIDE 0.9 % IV SOLN: INTRAVENOUS | Status: DC

## 2018-02-01 MED ORDER — PROPOFOL 500 MG/50ML IV EMUL
INTRAVENOUS | Status: DC | PRN
  Administered 2018-02-01: 160 ug/kg/min via INTRAVENOUS

## 2018-02-01 MED ORDER — FENTANYL CITRATE (PF) 100 MCG/2ML IJ SOLN
INTRAMUSCULAR | Status: DC | PRN
  Administered 2018-02-01 (×2): 50 ug via INTRAVENOUS

## 2018-02-01 NOTE — Op Note (Signed)
[facility] Gastroenterology Patient Name: Julie Stout Procedure Date: 02/01/2018 12:47 PM MRN: [id number] Account #: [id number] Date of Birth: [birth year] Admit Type: Outpatient Age: 67 Room: [facility] ENDO ROOM 2 Gender: Female Note Status: Finalized Procedure:            Colonoscopy Indications:          High risk colon cancer surveillance: Personal history                        of colonic polyps Providers:            Manya Silvas, MD Referring MD:         Darcus Austin (Referring MD) Medicines:            Propofol per Anesthesia Complications:        No immediate complications. Procedure:            Pre-Anesthesia Assessment:                       - After reviewing the risks and benefits, the patient                        was deemed in satisfactory condition to undergo the                        procedure.                       After obtaining informed consent, the colonoscope was                        passed under direct vision. Throughout the procedure,                        the patient's blood pressure, pulse, and oxygen                        saturations were monitored continuously. The                        Colonoscope was introduced through the anus and                        advanced to the the cecum, identified by appendiceal                        orifice and ileocecal valve. The colonoscopy was                        performed without difficulty. The patient tolerated the                        procedure well. The quality of the bowel preparation                        was excellent. Findings:      A diminutive polyp was found in the rectum. The polyp was sessile. The       polyp was removed with a jumbo cold forceps. Resection and retrieval       were complete.      Multiple small-mouthed diverticula were found in the  sigmoid colon and       descending colon.      Internal hemorrhoids were found during endoscopy. The hemorrhoids were      small and Grade I (internal hemorrhoids that do not prolapse).      The exam was otherwise without abnormality. Impression:           - One diminutive polyp in the rectum, removed with a                        jumbo cold forceps. Resected and retrieved.                       - Diverticulosis in the sigmoid colon and in the                        descending colon.                       - Internal hemorrhoids.                       - The examination was otherwise normal. Recommendation:       - Await pathology results. Manya Silvas, MD 02/01/2018 1:21:49 PM This report has been signed electronically. Number of Addenda: 0 Note Initiated On: 02/01/2018 12:47 PM Scope Withdrawal Time: 0 hours 11 minutes 39 seconds  Total Procedure Duration: 0 hours 19 minutes 58 seconds       [facility]

## 2018-02-01 NOTE — H&P (Signed)
Primary Care Physician:  Darcus Austin, MD Primary Gastroenterologist:  Dr. Vira Agar  Pre-Procedure History & Physical: HPI:  Julie Stout is a 67 y.o. female is here for an colonoscopy.   Past Medical History:  Diagnosis Date  . Hypertension   . Obesity   . Osteopenia   . Pes anserinus bursitis of right knee     Past Surgical History:  Procedure Laterality Date  . ABDOMINAL HYSTERECTOMY    . CHOLECYSTECTOMY    . COLONOSCOPY WITH PROPOFOL N/A 08/19/2014   Procedure: COLONOSCOPY WITH PROPOFOL;  Surgeon: Manya Silvas, MD;  Location: [facility] ENDOSCOPY;  Service: Endoscopy;  Laterality: N/A;    Prior to Admission medications   Medication Sig Start Date End Date Taking? Authorizing Provider  hydrochlorothiazide (MICROZIDE) 12.5 MG capsule Take by mouth.   Yes [provider]  ibuprofen (ADVIL,MOTRIN) 800 MG tablet Take 800 mg by mouth every 8 (eight) hours as needed.   Yes [provider]  lisinopril (PRINIVIL,ZESTRIL) 20 MG tablet Take by mouth.   Yes [provider]  cholecalciferol (VITAMIN D) 400 UNITS TABS tablet Take 400 Units by mouth.    [provider]    Allergies as of 12/08/2017  . (No Known Allergies)    History reviewed. No pertinent family history.  Social History   Socioeconomic History  . Marital status: Widowed    Spouse name: Not on file  . Number of children: Not on file  . Years of education: Not on file  . Highest education level: Not on file  Occupational History  . Not on file  Social Needs  . Financial resource strain: Not on file  . Food insecurity:    Worry: Not on file    Inability: Not on file  . Transportation needs:    Medical: Not on file    Non-medical: Not on file  Tobacco Use  . Smoking status: Never Smoker  . Smokeless tobacco: Never Used  Substance and Sexual Activity  . Alcohol use: Never    Frequency: Never  . Drug use: Never  . Sexual activity: Not on file  Lifestyle  .  Physical activity:    Days per week: Not on file    Minutes per session: Not on file  . Stress: Not on file  Relationships  . Social connections:    Talks on phone: Not on file    Gets together: Not on file    Attends religious service: Not on file    Active member of club or organization: Not on file    Attends meetings of clubs or organizations: Not on file    Relationship status: Not on file  . Intimate partner violence:    Fear of current or ex partner: Not on file    Emotionally abused: Not on file    Physically abused: Not on file    Forced sexual activity: Not on file  Other Topics Concern  . Not on file  Social History Narrative  . Not on file    Review of Systems: See HPI, otherwise negative ROS  Physical Exam: BP 123/76   Pulse 88   Temp 97.7 F (36.5 C) (Tympanic)   Resp 16   Ht 5\' 1"  (1.549 m)   Wt 83.9 kg   SpO2 98%   BMI 34.96 kg/m  General:   Alert,  pleasant and cooperative in NAD Head:  Normocephalic and atraumatic. Neck:  Supple; no masses or thyromegaly. Lungs:  Clear throughout to  auscultation.    Heart:  Regular rate and rhythm. Abdomen:  Soft, nontender and nondistended. Normal bowel sounds, without guarding, and without rebound.   Neurologic:  Alert and  oriented x4;  grossly normal neurologically.  Impression/Plan: Julie Stout is here for an colonoscopy to be performed for follow up of colonoscopy done  08/19/14 which showed polyps.  Risks, benefits, limitations, and alternatives regarding  colonoscopy have been reviewed with the patient.  Questions have been answered.  All parties agreeable.   Gaylyn Cheers, MD  02/01/2018, 12:53 PM

## 2018-02-01 NOTE — Transfer of Care (Signed)
Immediate Anesthesia Transfer of Care Note  Patient: Julie Stout  Procedure(s) Performed: COLONOSCOPY WITH PROPOFOL (N/A )  Patient Location: PACU and Endoscopy Unit  Anesthesia Type:General  Level of Consciousness: drowsy  Airway & Oxygen Therapy: Patient Spontanous Breathing and Patient connected to nasal cannula oxygen  Post-op Assessment: Report given to RN and Post -op Vital signs reviewed and stable  Post vital signs: Reviewed and stable  Last Vitals:  Vitals Value Taken Time  BP 93/60 02/01/2018  1:24 PM  Temp 36.1 C 02/01/2018  1:24 PM  Pulse 91 02/01/2018  1:24 PM  Resp 14 02/01/2018  1:24 PM  SpO2 100 % 02/01/2018  1:24 PM    Last Pain:  Vitals:   02/01/18 1324  TempSrc: Tympanic         Complications: No apparent anesthesia complications
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
## 2018-02-01 NOTE — Anesthesia Post-op Follow-up Note (Signed)
Anesthesia QCDR form completed.        

## 2018-02-02 ENCOUNTER — Encounter: Payer: Self-pay | Admitting: Unknown Physician Specialty

## 2018-02-03 LAB — SURGICAL PATHOLOGY

## 2018-02-09 NOTE — Anesthesia Postprocedure Evaluation (Signed)
Anesthesia Post Note  Patient: Julie Stout  Procedure(s) Performed: COLONOSCOPY WITH PROPOFOL (N/A )  Patient location during evaluation: Endoscopy Anesthesia Type: General Level of consciousness: awake and alert Pain management: pain level controlled Vital Signs Assessment: post-procedure vital signs reviewed and stable Respiratory status: spontaneous breathing, nonlabored ventilation and respiratory function stable Cardiovascular status: blood pressure returned to baseline and stable Postop Assessment: no apparent nausea or vomiting Anesthetic complications: no     Last Vitals:  Vitals:   02/01/18 1336 02/01/18 1344  BP: 99/74 118/78  Pulse: 86 82  Resp: 18 18  Temp:    SpO2: 99% 100%    Last Pain:  Vitals:   02/02/18 0729  TempSrc:   PainSc: 0-No pain                 Alphonsus Sias

## 2018-11-21 DIAGNOSIS — R7303 Prediabetes: Secondary | ICD-10-CM | POA: Diagnosis not present

## 2018-11-21 DIAGNOSIS — Z0000 Encounter for general adult medical examination without abnormal findings: Secondary | ICD-10-CM | POA: Diagnosis not present

## 2018-11-21 DIAGNOSIS — N183 Chronic kidney disease, stage 3 (moderate): Secondary | ICD-10-CM | POA: Diagnosis not present

## 2018-11-21 DIAGNOSIS — Z636 Dependent relative needing care at home: Secondary | ICD-10-CM | POA: Diagnosis not present

## 2018-11-21 DIAGNOSIS — I10 Essential (primary) hypertension: Secondary | ICD-10-CM | POA: Diagnosis not present

## 2018-11-21 DIAGNOSIS — E7800 Pure hypercholesterolemia, unspecified: Secondary | ICD-10-CM | POA: Diagnosis not present

## 2018-11-28 DIAGNOSIS — H2513 Age-related nuclear cataract, bilateral: Secondary | ICD-10-CM | POA: Diagnosis not present

## 2018-11-28 DIAGNOSIS — H35363 Drusen (degenerative) of macula, bilateral: Secondary | ICD-10-CM | POA: Diagnosis not present

## 2018-11-28 DIAGNOSIS — H5213 Myopia, bilateral: Secondary | ICD-10-CM | POA: Diagnosis not present

## 2019-05-22 DIAGNOSIS — I10 Essential (primary) hypertension: Secondary | ICD-10-CM | POA: Diagnosis not present

## 2019-05-22 DIAGNOSIS — R7303 Prediabetes: Secondary | ICD-10-CM | POA: Diagnosis not present

## 2019-11-29 DIAGNOSIS — Z636 Dependent relative needing care at home: Secondary | ICD-10-CM | POA: Diagnosis not present

## 2019-11-29 DIAGNOSIS — R7303 Prediabetes: Secondary | ICD-10-CM | POA: Diagnosis not present

## 2019-11-29 DIAGNOSIS — I10 Essential (primary) hypertension: Secondary | ICD-10-CM | POA: Diagnosis not present

## 2019-11-29 DIAGNOSIS — E7800 Pure hypercholesterolemia, unspecified: Secondary | ICD-10-CM | POA: Diagnosis not present

## 2019-11-29 DIAGNOSIS — M8580 Other specified disorders of bone density and structure, unspecified site: Secondary | ICD-10-CM | POA: Diagnosis not present

## 2019-11-29 DIAGNOSIS — N183 Chronic kidney disease, stage 3 unspecified: Secondary | ICD-10-CM | POA: Diagnosis not present

## 2019-11-29 DIAGNOSIS — Z0000 Encounter for general adult medical examination without abnormal findings: Secondary | ICD-10-CM | POA: Diagnosis not present

## 2020-06-10 DIAGNOSIS — R7303 Prediabetes: Secondary | ICD-10-CM | POA: Diagnosis not present

## 2020-06-10 DIAGNOSIS — I10 Essential (primary) hypertension: Secondary | ICD-10-CM | POA: Diagnosis not present

## 2020-06-10 DIAGNOSIS — N183 Chronic kidney disease, stage 3 unspecified: Secondary | ICD-10-CM | POA: Diagnosis not present

## 2020-06-10 DIAGNOSIS — E7800 Pure hypercholesterolemia, unspecified: Secondary | ICD-10-CM | POA: Diagnosis not present

## 2020-06-19 IMAGING — MG DIGITAL SCREENING BILATERAL MAMMOGRAM WITH TOMO AND CAD
8 series · 8 of 24 positions shown · non-contrast
Comparison: Previous exam(s).

CLINICAL DATA: Screening.

EXAM:
DIGITAL SCREENING BILATERAL MAMMOGRAM WITH TOMO AND CAD

[L CC synth-2D]
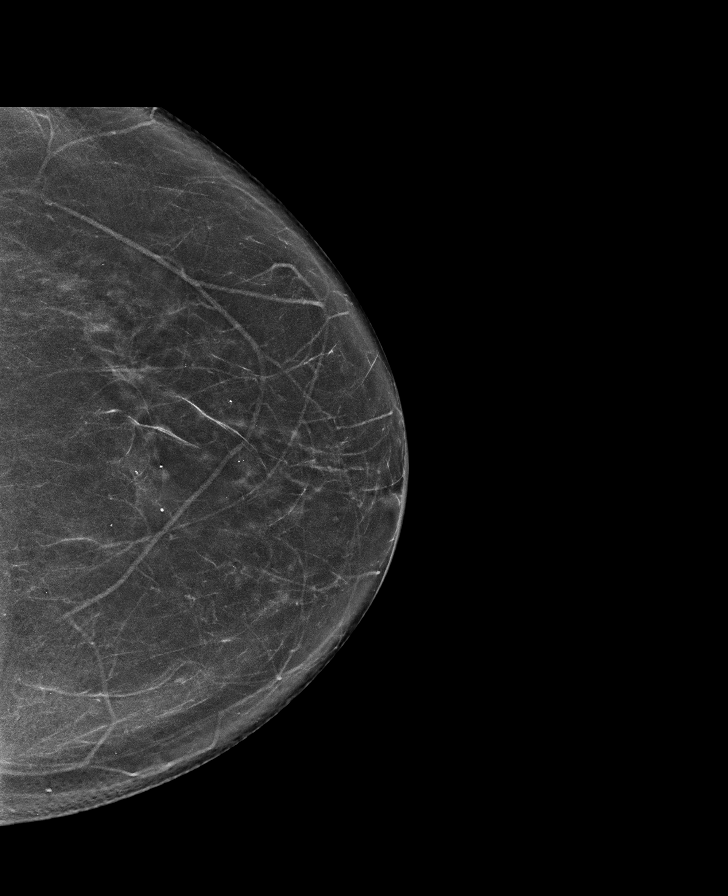

[L MLO synth-2D]
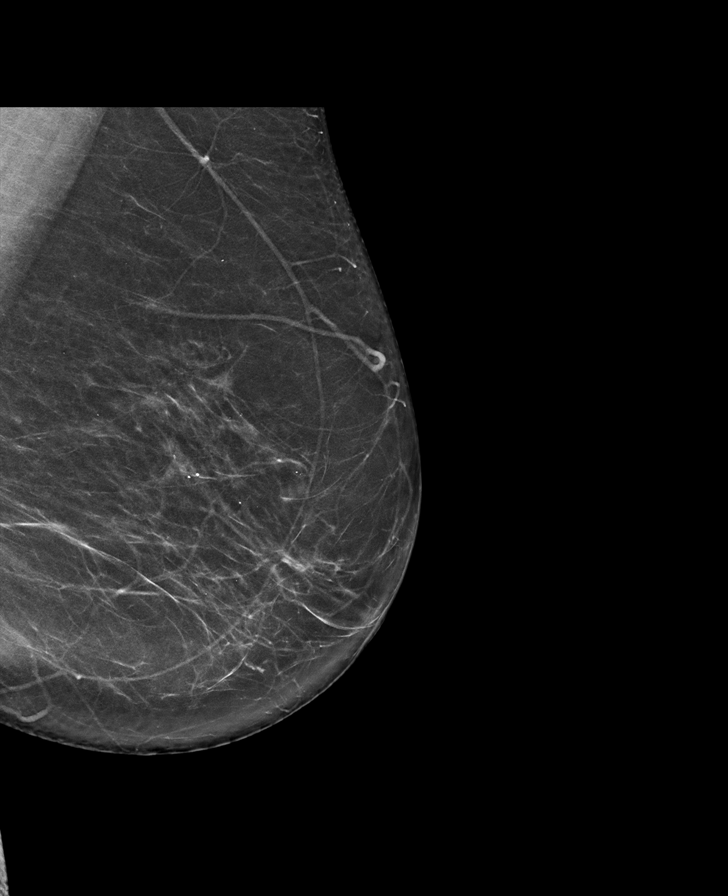

[R CC synth-2D]
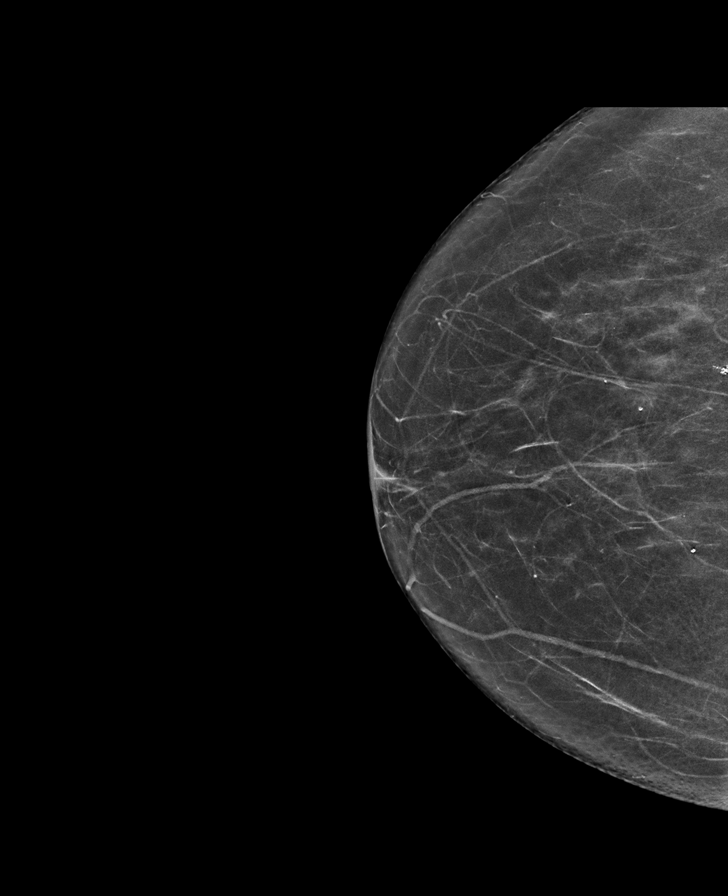

[R MLO synth-2D]
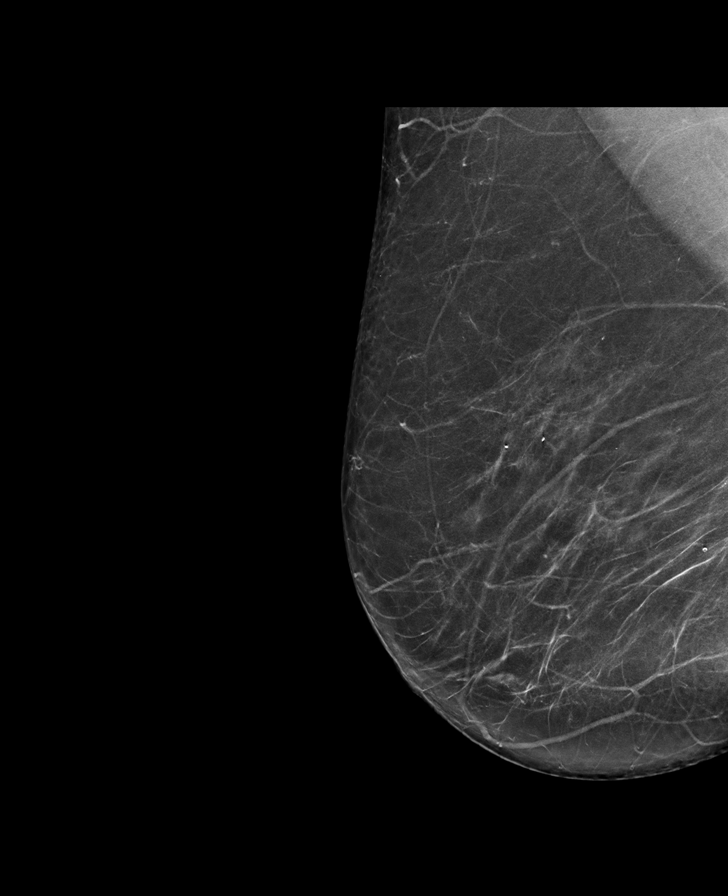

[R CC tomo · tomo slice 34/67.0]
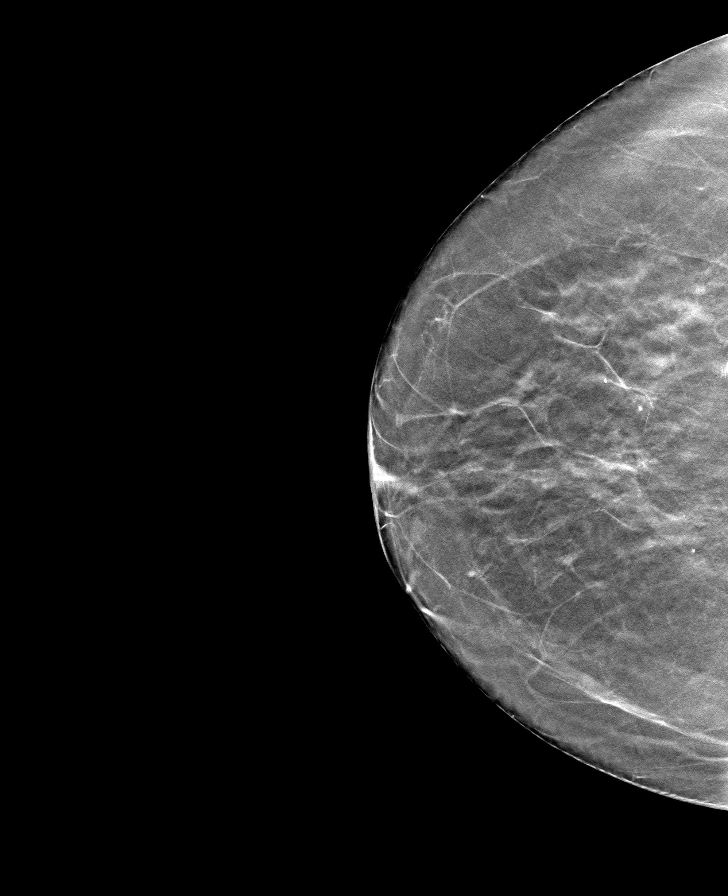

[L MLO tomo · tomo slice 39/76.0]
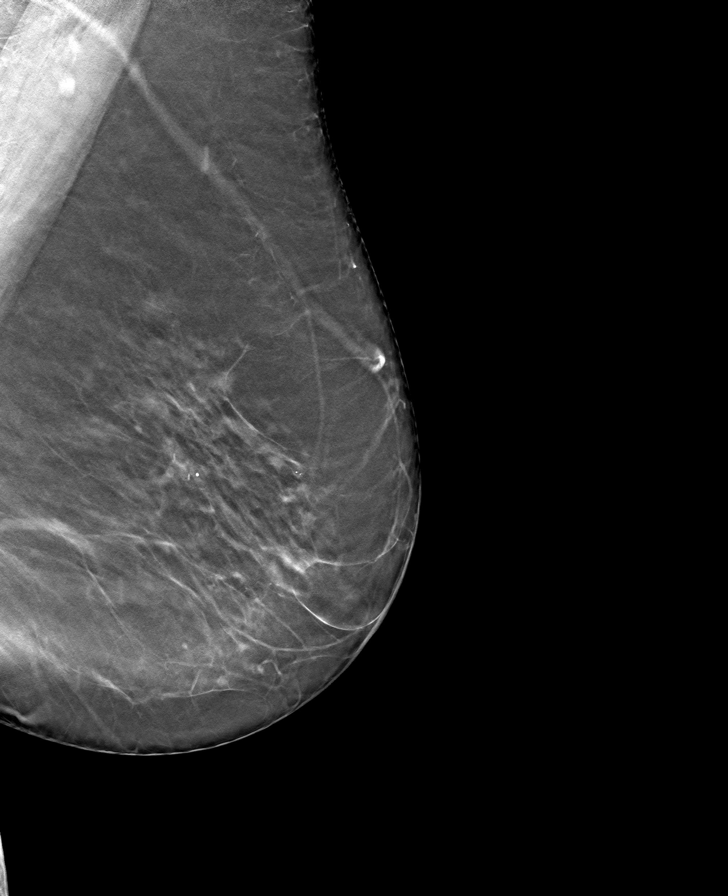

[L CC tomo · tomo slice 37/73.0]
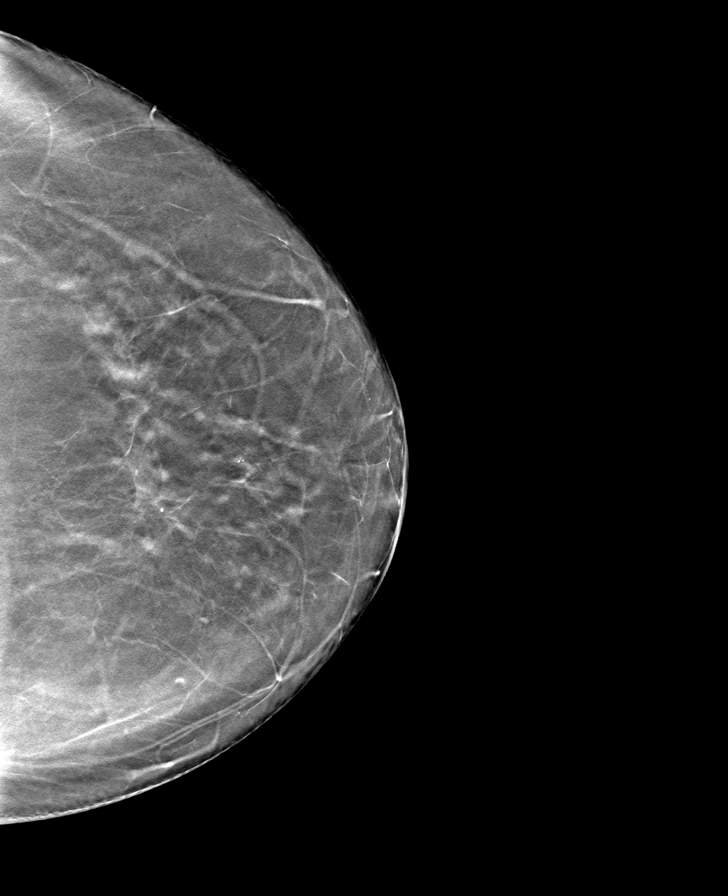

[R MLO tomo · tomo slice 40/79.0]
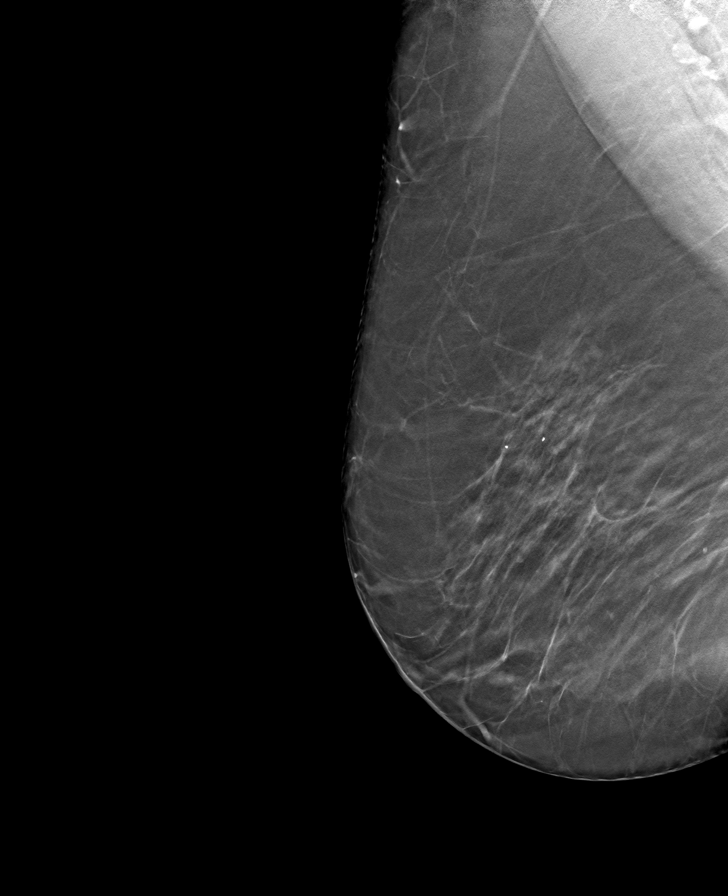

[8 of 24 positions shown; findings below may reference images not displayed]

ACR Breast Density Category b: There are scattered areas of
fibroglandular density.
FINDINGS: There are no findings suspicious for malignancy. Images were
processed with CAD.
IMPRESSION: No mammographic evidence of malignancy. A result letter of this
screening mammogram will be mailed directly to the patient.

RECOMMENDATION:
Screening mammogram in one year. (Code:[id number])

BI-RADS CATEGORY  1: Negative.

## 2020-06-30 ENCOUNTER — Other Ambulatory Visit: Payer: Self-pay

## 2020-06-30 ENCOUNTER — Emergency Department (HOSPITAL_COMMUNITY): Payer: PPO

## 2020-06-30 ENCOUNTER — Encounter (HOSPITAL_COMMUNITY): Payer: Self-pay

## 2020-06-30 ENCOUNTER — Emergency Department (HOSPITAL_COMMUNITY)
Admission: EM | Admit: 2020-06-30 | Discharge: 2020-06-30 | Disposition: A | Discharge status: Home / Self Care | Payer: PPO | Attending: Emergency Medicine | Admitting: Emergency Medicine

## 2020-06-30 DIAGNOSIS — I10 Essential (primary) hypertension: Secondary | ICD-10-CM | POA: Insufficient documentation

## 2020-06-30 DIAGNOSIS — Y92410 Unspecified street and highway as the place of occurrence of the external cause: Secondary | ICD-10-CM | POA: Insufficient documentation

## 2020-06-30 DIAGNOSIS — Z041 Encounter for examination and observation following transport accident: Secondary | ICD-10-CM | POA: Diagnosis not present

## 2020-06-30 DIAGNOSIS — S60311A Abrasion of right thumb, initial encounter: Secondary | ICD-10-CM | POA: Insufficient documentation

## 2020-06-30 DIAGNOSIS — Z79899 Other long term (current) drug therapy: Secondary | ICD-10-CM | POA: Insufficient documentation

## 2020-06-30 DIAGNOSIS — S6991XA Unspecified injury of right wrist, hand and finger(s), initial encounter: Secondary | ICD-10-CM | POA: Diagnosis present
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
## 2020-06-30 NOTE — Discharge Instructions (Addendum)
Follow-up with your primary doctor as needed.  Take Tylenol or Motrin for pain control.  Return for chest pain, difficulty breathing, abdominal pain or other new concerning symptom.
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
## 2020-06-30 NOTE — ED Notes (Signed)
Family at bedside. 

## 2020-07-04 DIAGNOSIS — H5213 Myopia, bilateral: Secondary | ICD-10-CM | POA: Diagnosis not present

## 2020-07-04 DIAGNOSIS — H2513 Age-related nuclear cataract, bilateral: Secondary | ICD-10-CM | POA: Diagnosis not present

## 2020-07-04 DIAGNOSIS — H35363 Drusen (degenerative) of macula, bilateral: Secondary | ICD-10-CM | POA: Diagnosis not present

## 2020-12-01 DIAGNOSIS — R7303 Prediabetes: Secondary | ICD-10-CM | POA: Diagnosis not present

## 2020-12-01 DIAGNOSIS — M8580 Other specified disorders of bone density and structure, unspecified site: Secondary | ICD-10-CM | POA: Diagnosis not present

## 2020-12-01 DIAGNOSIS — E559 Vitamin D deficiency, unspecified: Secondary | ICD-10-CM | POA: Diagnosis not present

## 2020-12-01 DIAGNOSIS — Z23 Encounter for immunization: Secondary | ICD-10-CM | POA: Diagnosis not present

## 2020-12-01 DIAGNOSIS — E7800 Pure hypercholesterolemia, unspecified: Secondary | ICD-10-CM | POA: Diagnosis not present

## 2020-12-01 DIAGNOSIS — I10 Essential (primary) hypertension: Secondary | ICD-10-CM | POA: Diagnosis not present

## 2020-12-01 DIAGNOSIS — N183 Chronic kidney disease, stage 3 unspecified: Secondary | ICD-10-CM | POA: Diagnosis not present

## 2020-12-01 DIAGNOSIS — Z0000 Encounter for general adult medical examination without abnormal findings: Secondary | ICD-10-CM | POA: Diagnosis not present

## 2021-02-06 DIAGNOSIS — R059 Cough, unspecified: Secondary | ICD-10-CM | POA: Diagnosis not present

## 2021-02-06 DIAGNOSIS — J019 Acute sinusitis, unspecified: Secondary | ICD-10-CM | POA: Diagnosis not present

## 2022-03-08 DIAGNOSIS — M1712 Unilateral primary osteoarthritis, left knee: Secondary | ICD-10-CM | POA: Diagnosis not present

## 2022-04-13 ENCOUNTER — Emergency Department: Payer: PPO

## 2022-04-13 ENCOUNTER — Emergency Department
Admission: EM | Admit: 2022-04-13 | Discharge: 2022-04-13 | Disposition: A | Discharge status: Home / Self Care | Payer: PPO | Attending: Emergency Medicine | Admitting: Emergency Medicine

## 2022-04-13 ENCOUNTER — Other Ambulatory Visit: Payer: Self-pay

## 2022-04-13 DIAGNOSIS — R7989 Other specified abnormal findings of blood chemistry: Secondary | ICD-10-CM | POA: Diagnosis not present

## 2022-04-13 DIAGNOSIS — N1330 Unspecified hydronephrosis: Secondary | ICD-10-CM | POA: Diagnosis not present

## 2022-04-13 DIAGNOSIS — N135 Crossing vessel and stricture of ureter without hydronephrosis: Secondary | ICD-10-CM

## 2022-04-13 DIAGNOSIS — N130 Hydronephrosis with ureteropelvic junction obstruction: Secondary | ICD-10-CM | POA: Diagnosis not present

## 2022-04-13 DIAGNOSIS — I10 Essential (primary) hypertension: Secondary | ICD-10-CM | POA: Insufficient documentation

## 2022-04-13 DIAGNOSIS — R7309 Other abnormal glucose: Secondary | ICD-10-CM | POA: Insufficient documentation

## 2022-04-13 DIAGNOSIS — N201 Calculus of ureter: Secondary | ICD-10-CM | POA: Diagnosis not present

## 2022-04-13 DIAGNOSIS — R1032 Left lower quadrant pain: Secondary | ICD-10-CM | POA: Diagnosis not present

## 2022-04-13 DIAGNOSIS — R109 Unspecified abdominal pain: Secondary | ICD-10-CM | POA: Diagnosis present

## 2022-04-13 DIAGNOSIS — I700 Atherosclerosis of aorta: Secondary | ICD-10-CM | POA: Diagnosis not present

## 2022-04-13 DIAGNOSIS — K760 Fatty (change of) liver, not elsewhere classified: Secondary | ICD-10-CM | POA: Diagnosis not present

## 2022-04-13 LAB — COMPREHENSIVE METABOLIC PANEL
ALT: 44 U/L (ref 0–44)
AST: 32 U/L (ref 15–41)
Albumin: 3.9 g/dL (ref 3.5–5.0)
Alkaline Phosphatase: 91 U/L (ref 38–126)
Anion gap: 12 (ref 5–15)
BUN: 25 mg/dL — ABNORMAL HIGH (ref 8–23)
CO2: 24 mmol/L (ref 22–32)
Calcium: 9.9 mg/dL (ref 8.9–10.3)
Chloride: 103 mmol/L (ref 98–111)
Creatinine, Ser: 1.12 mg/dL — ABNORMAL HIGH (ref 0.44–1.00)
GFR, Estimated: 52 mL/min — ABNORMAL LOW (ref >60)
Glucose, Bld: 245 mg/dL — ABNORMAL HIGH (ref 70–99)
Potassium: 3.7 mmol/L (ref 3.5–5.1)
Sodium: 139 mmol/L (ref 135–145)
Total Bilirubin: 0.6 mg/dL (ref 0.3–1.2)
Total Protein: 7.3 g/dL (ref 6.5–8.1)

## 2022-04-13 LAB — URINALYSIS, ROUTINE W REFLEX MICROSCOPIC
Bilirubin Urine: NEGATIVE
Glucose, UA: 150 mg/dL — AB
Ketones, ur: NEGATIVE mg/dL
Nitrite: NEGATIVE
Protein, ur: 30 mg/dL — AB
RBC / HPF: >50 RBC/hpf (ref 0–5)
Specific Gravity, Urine: 1.025 (ref 1.005–1.030)
pH: 5 (ref 5.0–8.0)

## 2022-04-13 LAB — CBC
HCT: 42.8 % (ref 36.0–46.0)
Hemoglobin: 14.3 g/dL (ref 12.0–15.0)
MCH: 30 pg (ref 26.0–34.0)
MCHC: 33.4 g/dL (ref 30.0–36.0)
MCV: 89.9 fL (ref 80.0–100.0)
Platelets: 270 10*3/uL (ref 150–400)
RBC: 4.76 MIL/uL (ref 3.87–5.11)
RDW: 12.3 % (ref 11.5–15.5)
WBC: 9.1 10*3/uL (ref 4.0–10.5)
nRBC: 0 % (ref 0.0–0.2)

## 2022-04-13 LAB — LIPASE, BLOOD: Lipase: 52 U/L — ABNORMAL HIGH (ref 11–51)

## 2022-04-13 MED ORDER — ONDANSETRON HCL 4 MG/2ML IJ SOLN
4.0000 mg | Freq: Once | INTRAMUSCULAR | Status: AC
  Administered 2022-04-13: 4 mg via INTRAVENOUS
  Filled 2022-04-13: qty 2

## 2022-04-13 MED ORDER — OXYCODONE-ACETAMINOPHEN 5-325 MG PO TABS: 1.0000 | ORAL_TABLET | Freq: Three times a day (TID) | ORAL | 0 refills | Status: AC | PRN

## 2022-04-13 MED ORDER — MORPHINE SULFATE (PF) 4 MG/ML IV SOLN
4.0000 mg | Freq: Once | INTRAVENOUS | Status: AC
  Administered 2022-04-13: 4 mg via INTRAVENOUS
  Filled 2022-04-13: qty 1

## 2022-04-13 MED ORDER — IOHEXOL 300 MG/ML  SOLN
100.0000 mL | Freq: Once | INTRAMUSCULAR | Status: AC | PRN
  Administered 2022-04-13: 100 mL via INTRAVENOUS

## 2022-04-13 MED ORDER — SODIUM CHLORIDE 0.9 % IV BOLUS
1000.0000 mL | Freq: Once | INTRAVENOUS | Status: AC
  Administered 2022-04-13: 1000 mL via INTRAVENOUS

## 2022-04-13 MED ORDER — ONDANSETRON 4 MG PO TBDP: 4.0000 mg | ORAL_TABLET | Freq: Three times a day (TID) | ORAL | 0 refills | Status: AC | PRN

## 2022-04-13 MED ORDER — MELOXICAM 15 MG PO TABS: 15.0000 mg | ORAL_TABLET | Freq: Every day | ORAL | 0 refills | Status: DC

## 2022-04-13 NOTE — Discharge Instructions (Addendum)
-  Your CT scan shows that you have multiple kidney stones that are causing a ureterovesical junction obstruction.  This will require treatment.  Please call the urologist listed on this page to schedule an appointment for follow-up.  -You may take the meloxicam and oxycodone/acetaminophen as needed for pain.  You may take the ondansetron as needed for nausea.  -Return to the emergency department anytime if you begin to experience any new or worsening symptoms.

## 2022-04-13 NOTE — ED Provider Notes (Signed)
[facility] Provider Note    Event Date/Time   First MD Initiated Contact with Patient 04/13/22 1133     (approximate)   History   Chief Complaint Flank Pain (L side)   HPI Julie Stout is a 72 y.o. female, history of hypertension, osteopenia, obesity, presents to the emergency department for evaluation of left-sided groin/flank pain.  Patient states that this started around 0700 this morning, associated with nausea as well.  Denies fever/chills, chest pain, shortness of breath, vomiting, diarrhea, urinary symptoms, headache, weakness, rashes, or dizziness/lightheadedness.  History Limitations: No limitations.        Physical Exam  Triage Vital Signs: ED Triage Vitals [04/13/22 1106]  Enc Vitals Group     BP (!) 160/94     Pulse Rate 83     Resp 17     Temp 98.2 F (36.8 C)     Temp Source Oral     SpO2 98 %     Weight      Height      Head Circumference      Peak Flow      Pain Score 8     Pain Loc      Pain Edu?      Excl. in [place]?     Most recent vital signs: Vitals:   04/13/22 1330 04/13/22 1638  BP: (!) 151/84 (!) 148/79  Pulse: (!) 109 87  Resp: 18 19  Temp:  98 F (36.7 C)  SpO2: 97% 99%    General: Awake, NAD.  Skin: Warm, dry. No rashes or lesions.  Eyes: PERRL. Conjunctivae normal.  CV: Good peripheral perfusion.  Resp: Normal effort.  Lung sounds are clear bilaterally. Abd: Soft, non-tender. No distention.  Negative CVAT bilaterally. Neuro: At baseline. No gross neurological deficits.  Musculoskeletal: Normal ROM of all extremities.   Physical Exam    ED Results / Procedures / Treatments  Labs (all labs ordered are listed, but only abnormal results are displayed) Labs Reviewed  URINALYSIS, ROUTINE W REFLEX MICROSCOPIC - Abnormal; Notable for the following components:      Result Value   Color, Urine YELLOW (*)    APPearance CLOUDY (*)    Glucose, UA 150 (*)    Hgb urine dipstick LARGE (*)     Protein, ur 30 (*)    Leukocytes,Ua LARGE (*)    Bacteria, UA FEW (*)    All other components within normal limits  COMPREHENSIVE METABOLIC PANEL - Abnormal; Notable for the following components:   Glucose, Bld 245 (*)    BUN 25 (*)    Creatinine, Ser 1.12 (*)    GFR, Estimated 52 (*)    All other components within normal limits  LIPASE, BLOOD - Abnormal; Notable for the following components:   Lipase 52 (*)    All other components within normal limits  CBC     EKG N/A.    RADIOLOGY  ED Provider Interpretation: I personally reviewed and interpreted the CT scan, bilateral UPJ obstruction noted.  CT Abdomen Pelvis W Contrast  Result Date: 04/13/2022 CLINICAL DATA:  LEFT lower quadrant abdominal pain a 72 year old female. EXAM: CT ABDOMEN AND PELVIS WITH CONTRAST TECHNIQUE: Multidetector CT imaging of the abdomen and pelvis was performed using the standard protocol following bolus administration of intravenous contrast. RADIATION DOSE REDUCTION: This exam was performed according to the departmental dose-optimization program which includes automated exposure control, adjustment of the mA and/or kV according to patient size and/or  use of iterative reconstruction technique. CONTRAST:  139m OMNIPAQUE IOHEXOL 300 MG/ML  SOLN COMPARISON:  None available FINDINGS: Lower chest: Incidental imaging of the lung bases without signs of effusion or consolidative change. Visualized portions of the heart are unremarkable and there is no chest wall abnormality. Hepatobiliary: Hepatic steatosis moderate-to-marked with changes of cholecystectomy. No substantial biliary duct dilation accounting for cholecystectomy changes. Portal vein is patent. Pancreas: Normal, without mass, inflammation or ductal dilatation. Spleen: Normal. Adrenals/Urinary Tract: Adrenal glands are normal. LEFT kidney: Numerous renal calculi with moderate hydronephrosis due to a LEFT UVJ calculus measuring 7 x 8 mm. Caliceal diverticulum  of the upper pole the LEFT kidney with a large infundibular calculus leading to calyceal dilation. This upper pole calculus measuring 17 x 12 mm. Small calculi in the dilated upper pole calyx on image 31/2 largest 7 mm. Another caliceal diverticulum along the lateral aspect shows a smaller 4 mm calculus in the lower pole. Irregularity of the lateral wall may simply represent compressed renal medulla but is indeterminate measuring up to 5 mm greatest thickness (image 31/2) RIGHT kidney: Large 11 x 12 mm RIGHT UPJ calculus leading to severe RIGHT hydronephrosis but without perinephric stranding. Three additional calculi in RIGHT renal collecting systems, 2 in the RIGHT renal pelvis largest at 11 mm. Distal ureters are decompressed.  No perivesical stranding. Stomach/Bowel: Small hiatal hernia. No signs of bowel obstruction or acute bowel process. Normal appendix. Vascular/Lymphatic: Aortic atherosclerosis. No sign of aneurysm. Smooth contour of the IVC. There is no gastrohepatic or hepatoduodenal ligament lymphadenopathy. No retroperitoneal or mesenteric lymphadenopathy. No pelvic sidewall lymphadenopathy. Aorta with smooth contours. IVC with smooth contours. No aneurysmal dilation of the abdominal aorta. There is no gastrohepatic or hepatoduodenal ligament lymphadenopathy. No retroperitoneal or mesenteric lymphadenopathy. No pelvic sidewall lymphadenopathy. Reproductive: Post hysterectomy. Other: No ascites.  No pneumoperitoneum. Musculoskeletal: No acute bone finding. No destructive bone process. Spinal degenerative changes. IMPRESSION: 1. Bilateral UPJ obstruction due to large calculi. 2. Moderately large calculus on the LEFT obstructs the ureteropelvic junction with numerous calculi in the LEFT kidney elsewhere. Perinephric stranding raising the question of acute obstruction. Suggest correlation with urinalysis to exclude concomitant urinary tract infection. 3. RIGHT urinary tract obstruction may have a chronic  element and should be correlated with symptoms but results in severe hydronephrosis and parenchymal thinning. 4. Calyceal diverticula the in the upper pole of the LEFT kidney likely due to a large infundibular calculus leading to segmental hydronephrosis. Irregularity of the lateral upper pole hydronephrotic calyx or diverticulum does not allow for exclusion of urothelial neoplasm. Urologic consultation and close follow-up after relief of the obstruction is suggested. 5. Moderate to marked hepatic steatosis. 6. Small hiatal hernia. 7. Aortic atherosclerosis. Aortic Atherosclerosis (ICD10-I70.0). Electronically Signed   By: GZetta BillsM.D.   On: 04/13/2022 13:44    PROCEDURES:  Critical Care performed: N/A.  Procedures    MEDICATIONS ORDERED IN ED: Medications  sodium chloride 0.9 % bolus 1,000 mL (0 mLs Intravenous Stopped 04/13/22 1427)  ondansetron (ZOFRAN) injection 4 mg (4 mg Intravenous Given 04/13/22 1213)  morphine (PF) 4 MG/ML injection 4 mg (4 mg Intravenous Given 04/13/22 1213)  iohexol (OMNIPAQUE) 300 MG/ML solution 100 mL (100 mLs Intravenous Contrast Given 04/13/22 1319)     IMPRESSION / MDM / ASSESSMENT AND PLAN / ED COURSE  I reviewed the triage vital signs and the nursing notes.  Differential diagnosis includes, but is not limited to, ureterolithiasis, diverticulitis, colitis, pyelonephritis, cystitis, ovarian torsion, ovarian cyst.  ED Course Patient appears uncomfortable, but stable.  Vitals within normal limits.  Will initiate IV fluids, ondansetron, and morphine.  CBC shows no leukocytosis or anemia.  CMP shows elevated glucose at 245, as well as elevated creatinine 1.12.  Otherwise no significant electrolyte abnormalities or transaminitis.  Lipase slightly elevated at 52.  Urinalysis shows large amount of hemoglobin, large leukocytes, few bacteria, though does show several squamous epithelial cells.  Likely  contamination.  Assessment/Plan Patient presents with left-sided flank pain that started this morning.  She does have some left-sided CVAT on exam, she is otherwise stable.  Vitals within normal limits.  Her lab workup is reassuring with exception of mild bump in creatinine at 1.12.  Her CT scan did show several large calculi causing bilateral UPJ obstruction.  Urinalysis shows some leukocytes and bacteria, though likely contamination due to the large amount of squamous epithelial cells.  Patient is afebrile at this time with no leukocytosis.  Spoke to the on-call urologist, Dr. Bernardo Heater, who reviewed the images, urinalysis, and lab workup, and recommended scheduling treatment outpatient within the next few days as long as the patient's pain was controlled.  Spoke to the patient about admission versus following up outpatient, patient states that she overall feels well and would like to follow-up outpatient.  I believe this is reasonable.  Provided her with analgesics and antiemetics and strongly advised her to ensure follow-up with Dr. Bernardo Heater.  She was agreeable to this.  Will discharge.  Considered admission for this patient, but given her stable presentation and access to close follow-up, she is unlikely benefit from admission.  Provided the patient with anticipatory guidance, return precautions, and educational material. Encouraged the patient to return to the emergency department at any time if they begin to experience any new or worsening symptoms. Patient expressed understanding and agreed with the plan.   Patient's presentation is most consistent with acute complicated illness / injury requiring diagnostic workup.     FINAL CLINICAL IMPRESSION(S) / ED DIAGNOSES   Final diagnoses:  Ureterovesical junction (UVJ) obstruction     Rx / DC Orders   ED Discharge Orders          Ordered    oxyCODONE-acetaminophen (PERCOCET/ROXICET) 5-325 MG tablet  Every 8 hours PRN        04/13/22 1618     meloxicam (MOBIC) 15 MG tablet  Daily        04/13/22 1618    ondansetron (ZOFRAN-ODT) 4 MG disintegrating tablet  Every 8 hours PRN        04/13/22 1618             Note:  This document was prepared using Dragon voice recognition software and may include unintentional dictation errors.   Teodoro Spray, [place] 04/13/22 1657    Naaman Plummer, MD 04/14/22 [phone]

## 2022-04-13 NOTE — ED Notes (Signed)
Pt informed of need for urine sample, unable to provide at this time, WCTM.

## 2022-04-13 NOTE — ED Triage Notes (Signed)
Pt presents to ED via POV with steady gait with c/o of L groin/flank pain. Pt states HX of kidney stone 20 years ago. Pt denies urinary s/s.

## 2022-04-13 NOTE — ED Notes (Signed)
Pt still unable to provide urine sample, WCTM

## 2022-04-14 ENCOUNTER — Telehealth: Payer: Self-pay

## 2022-04-14 ENCOUNTER — Ambulatory Visit (INDEPENDENT_AMBULATORY_CARE_PROVIDER_SITE_OTHER): Payer: PPO | Admitting: Urology

## 2022-04-14 ENCOUNTER — Other Ambulatory Visit: Payer: Self-pay | Admitting: Urology

## 2022-04-14 ENCOUNTER — Encounter: Payer: Self-pay | Admitting: Urology

## 2022-04-14 VITALS — BP 134/83 | HR 111 | Ht 61.0 in | Wt 200.0 lb

## 2022-04-14 DIAGNOSIS — N1330 Unspecified hydronephrosis: Secondary | ICD-10-CM

## 2022-04-14 DIAGNOSIS — N132 Hydronephrosis with renal and ureteral calculous obstruction: Secondary | ICD-10-CM

## 2022-04-14 DIAGNOSIS — N200 Calculus of kidney: Secondary | ICD-10-CM

## 2022-04-14 DIAGNOSIS — N23 Unspecified renal colic: Secondary | ICD-10-CM

## 2022-04-14 DIAGNOSIS — N201 Calculus of ureter: Secondary | ICD-10-CM

## 2022-04-14 LAB — MICROSCOPIC EXAMINATION
Epithelial Cells (non renal): >10 /HPF — AB (ref 0–10)
RBC, Urine: >30 /HPF — AB (ref 0–2)

## 2022-04-14 LAB — URINALYSIS, COMPLETE
Bilirubin, UA: NEGATIVE
Glucose, UA: NEGATIVE
Ketones, UA: NEGATIVE
Nitrite, UA: NEGATIVE
Specific Gravity, UA: 1.02 (ref 1.005–1.030)
Urobilinogen, Ur: 0.2 mg/dL (ref 0.2–1.0)
pH, UA: 5.5 (ref 5.0–7.5)

## 2022-04-14 NOTE — Progress Notes (Signed)
Surgical Physician Order Form [facility]  * Scheduling expectation :  03/2021  *Length of Case: 2 hours  *Clearance needed: no  *Anticoagulation Instructions: N/A  *Aspirin Instructions: N/A  *Post-op visit Date/Instructions:   TBD  *Diagnosis:  Bilateral nephrolithiasis  *Procedure: Left Ureteroscopy w/laser lithotripsy & stent placement [id number]); right ureteral stent placement   Additional orders: N/A  -Admit type: OUTpatient  -Anesthesia: General  -VTE Prophylaxis Standing Order SCD's       Other:   -Standing Lab Orders Per Anesthesia    Lab other: None  -Standing Test orders EKG/Chest x-ray per Anesthesia       Test other:   - Medications:  Ceftriaxone(Rocephin) 1gm IV  -Other orders:  N/A

## 2022-04-14 NOTE — Telephone Encounter (Signed)
I spoke with Julie Stout. We have discussed possible surgery dates and Friday February 23rd, 2024 was agreed upon by all parties. Patient given information about surgery date, what to expect pre-operatively and post operatively.  We discussed that a Pre-Admission Testing office will be calling to set up the pre-op visit that will take place prior to surgery, and that these appointments are typically done over the phone with a Pre-Admissions RN. Informed patient that our office will communicate any additional care to be provided after surgery. Patients questions or concerns were discussed during our call. Advised to call our office should there be any additional information, questions or concerns that arise. Patient verbalized understanding.

## 2022-04-14 NOTE — H&P (View-Only) (Signed)
 04/14/2022 8:47 PM   Julie Stout 11/02/1950 [id number]  Referring provider: No referring provider defined for this encounter.  Chief Complaint  Patient presents with   Nephrolithiasis    HPI: Julie Stout is a 71 y.o. female presents for follow-up of recent ED visit for renal colic.  Acute onset 04/13/2022 LLQ abdominal pain with radiation into the left flank region.  Pain severe without identifiable precipitating, aggravating or alleviating factors Positive nausea with dry heaves.  No fever or chills ED visit secondary to severe pain.  Evaluation included UA showing >50 RBC/21-50 WBC/21-50 squamous epithelial cells CT abdomen pelvis with contrast remarkable for bilateral UPJ calculi with obstruction and numerous bilateral renal calculi.  A 8 mm left distal ureteral calculus was also noted.  Right hydronephrosis noted to be severe with parenchymal thinning suggesting chronic obstruction Creatinine mildly elevated 1.12 Pain was controlled with parenteral analgesics and she was discharged on oxycodone, ondansetron and meloxicam Since her ED visit her pain has not been severe though increase left lower quadrant pain today.  She is only taking 1 oxycodone every 8 hours 1 prior stone episode 20 years ago   PMH: Past Medical History:  Diagnosis Date   Hypertension    Obesity    Osteopenia    Pes anserinus bursitis of right knee     Surgical History: Past Surgical History:  Procedure Laterality Date   ABDOMINAL HYSTERECTOMY     CHOLECYSTECTOMY     COLONOSCOPY WITH PROPOFOL N/A 08/19/2014   Procedure: COLONOSCOPY WITH PROPOFOL;  Surgeon: Robert T Elliott, MD;  Location: [facility] ENDOSCOPY;  Service: Endoscopy;  Laterality: N/A;   COLONOSCOPY WITH PROPOFOL N/A 02/01/2018   Procedure: COLONOSCOPY WITH PROPOFOL;  Surgeon: Elliott, Robert T, MD;  Location: [facility] ENDOSCOPY;  Service: Endoscopy;  Laterality: N/A;    Home Medications:  Allergies as of 04/14/2022   No Known  Allergies      Medication List        Accurate as of April 14, 2022  8:47 PM. If you have any questions, ask your nurse or doctor.          cholecalciferol 10 MCG (400 UNIT) Tabs tablet Commonly known as: VITAMIN D3 Take 400 Units by mouth.   hydrochlorothiazide 12.5 MG capsule Commonly known as: MICROZIDE Take by mouth.   ibuprofen 800 MG tablet Commonly known as: ADVIL Take 800 mg by mouth every 8 (eight) hours as needed.   lisinopril 20 MG tablet Commonly known as: ZESTRIL Take by mouth.   meloxicam 15 MG tablet Commonly known as: MOBIC Take 1 tablet (15 mg total) by mouth daily for 10 days.   ondansetron 4 MG disintegrating tablet Commonly known as: ZOFRAN-ODT Take 1 tablet (4 mg total) by mouth every 8 (eight) hours as needed for nausea or vomiting.   oxyCODONE-acetaminophen 5-325 MG tablet Commonly known as: PERCOCET/ROXICET Take 1 tablet by mouth every 8 (eight) hours as needed for up to 7 days.        Allergies: No Known Allergies  Family History: No family history on file.  Social History:  reports that she has never smoked. She has never used smokeless tobacco. She reports that she does not drink alcohol and does not use drugs.   Physical Exam: BP 134/83   Pulse (!) 111   Ht 5' 1" (1.549 m)   Wt 200 lb (90.7 kg)   BMI 37.79 kg/m   Constitutional:  Alert and oriented, No acute distress. HEENT: [place] AT, moist   mucus membranes.  Trachea midline, no masses. Cardiovascular: RRR. Respiratory: Normal respiratory effort, lungs clear. GI: Abdomen is soft, nontender, nondistended, no abdominal masses Psychiatric: Normal mood and affect.  Laboratory Data:  Urinalysis Microscopy 11-30 WBC/>30 RBC/>10 epis   Pertinent Imaging: CT images were personally reviewed and interpreted   Assessment & Plan:   71 y.o. female with bilateral renal pelvic calculi with bilateral hydronephrosis.  There is severe right hydronephrosis with parenchymal  thinning consistent with chronic obstruction.  An 8 mm left distal ureteral calculus is also noted Her creatinine is minimally elevated and have recommended treatment this week We discussed various treatment options for urolithiasis including observation with or without medical expulsive therapy, shockwave lithotripsy (SWL), ureteroscopy and laser lithotripsy with stent placement, and percutaneous nephrolithotomy. We discussed that management is based on stone size, location, density, patient co-morbidities, and patient preference.  Stones <5mm in size have a >80% spontaneous passage rate. Data surrounding the use of tamsulosin for medical expulsive therapy is controversial, but meta analyses suggests it is most efficacious for distal stones between 5-10mm in size.  SWL has a lower stone free rate in a single procedure, but also a lower complication rate compared to ureteroscopy and avoids a stent and associated stent related symptoms. Possible complications include renal hematoma, steinstrasse, and need for additional treatment. Ureteroscopy with laser lithotripsy and stent placement has a higher stone free rate than SWL in a single procedure, however increased complication rate including possible infection, ureteral injury, bleeding, and stent related morbidity. Common stent related symptoms include dysuria, urgency/frequency, and flank pain. PCNL is the favored treatment for stones >2cm. It involves a small incision in the flank, with complete fragmentation of stones and removal. It has the highest stone free rate, but also the highest complication rate. Possible complications include bleeding, infection/sepsis, injury to surrounding organs including the pleura, and collecting system injury. She does not desire PCNL and due to the multiplicity of renal calculi SWL would require multiple treatments After an extensive discussion of the risks and benefits of the above treatment options, the patient would  like to proceed with ureteroscopy. Will plan cystoscopy with bilateral ureteral stent placement and left ureteroscopy with laser lithotripsy/stone removal.  Due to stone burden on the left this may need to be a staged procedure. The contralateral side will be treated at a later date Urinalysis shows pyuria however no signs of infection and significant epithelial cells indicating vaginal contamination She was informed she could take oxycodone 1-2 tabs every 6 hours if needed for pain control   Sereen Schaff C Verlyn Lambert, MD  [facility] [address], [address] [place], [place] [address] [phone] 

## 2022-04-14 NOTE — Progress Notes (Signed)
04/14/2022 8:47 PM   Julie Stout March 31, 1950 [id number]  Referring provider: No referring provider defined for this encounter.  Chief Complaint  Patient presents with   Nephrolithiasis    HPI: Julie Stout is a 72 y.o. female presents for follow-up of recent ED visit for renal colic.  Acute onset 04/13/2022 LLQ abdominal pain with radiation into the left flank region.  Pain severe without identifiable precipitating, aggravating or alleviating factors Positive nausea with dry heaves.  No fever or chills ED visit secondary to severe pain.  Evaluation included UA showing >50 RBC/21-50 WBC/21-50 squamous epithelial cells CT abdomen pelvis with contrast remarkable for bilateral UPJ calculi with obstruction and numerous bilateral renal calculi.  A 8 mm left distal ureteral calculus was also noted.  Right hydronephrosis noted to be severe with parenchymal thinning suggesting chronic obstruction Creatinine mildly elevated 1.12 Pain was controlled with parenteral analgesics and she was discharged on oxycodone, ondansetron and meloxicam Since her ED visit her pain has not been severe though increase left lower quadrant pain today.  She is only taking 1 oxycodone every 8 hours 1 prior stone episode 20 years ago   PMH: Past Medical History:  Diagnosis Date   Hypertension    Obesity    Osteopenia    Pes anserinus bursitis of right knee     Surgical History: Past Surgical History:  Procedure Laterality Date   ABDOMINAL HYSTERECTOMY     CHOLECYSTECTOMY     COLONOSCOPY WITH PROPOFOL N/A 08/19/2014   Procedure: COLONOSCOPY WITH PROPOFOL;  Surgeon: Manya Silvas, MD;  Location: [facility] ENDOSCOPY;  Service: Endoscopy;  Laterality: N/A;   COLONOSCOPY WITH PROPOFOL N/A 02/01/2018   Procedure: COLONOSCOPY WITH PROPOFOL;  Surgeon: Manya Silvas, MD;  Location: [facility] ENDOSCOPY;  Service: Endoscopy;  Laterality: N/A;    Home Medications:  Allergies as of 04/14/2022   No Known  Allergies      Medication List        Accurate as of April 14, 2022  8:47 PM. If you have any questions, ask your nurse or doctor.          cholecalciferol 10 MCG (400 UNIT) Tabs tablet Commonly known as: VITAMIN D3 Take 400 Units by mouth.   hydrochlorothiazide 12.5 MG capsule Commonly known as: MICROZIDE Take by mouth.   ibuprofen 800 MG tablet Commonly known as: ADVIL Take 800 mg by mouth every 8 (eight) hours as needed.   lisinopril 20 MG tablet Commonly known as: ZESTRIL Take by mouth.   meloxicam 15 MG tablet Commonly known as: MOBIC Take 1 tablet (15 mg total) by mouth daily for 10 days.   ondansetron 4 MG disintegrating tablet Commonly known as: ZOFRAN-ODT Take 1 tablet (4 mg total) by mouth every 8 (eight) hours as needed for nausea or vomiting.   oxyCODONE-acetaminophen 5-325 MG tablet Commonly known as: PERCOCET/ROXICET Take 1 tablet by mouth every 8 (eight) hours as needed for up to 7 days.        Allergies: No Known Allergies  Family History: No family history on file.  Social History:  reports that she has never smoked. She has never used smokeless tobacco. She reports that she does not drink alcohol and does not use drugs.   Physical Exam: BP 134/83   Pulse (!) 111   Ht 5' 1"$  (1.549 m)   Wt 200 lb (90.7 kg)   BMI 37.79 kg/m   Constitutional:  Alert and oriented, No acute distress. HEENT: [place] AT, moist  mucus membranes.  Trachea midline, no masses. Cardiovascular: RRR. Respiratory: Normal respiratory effort, lungs clear. GI: Abdomen is soft, nontender, nondistended, no abdominal masses Psychiatric: Normal mood and affect.  Laboratory Data:  Urinalysis Microscopy 11-30 WBC/>30 RBC/>10 epis   Pertinent Imaging: CT images were personally reviewed and interpreted   Assessment & Plan:   72 y.o. female with bilateral renal pelvic calculi with bilateral hydronephrosis.  There is severe right hydronephrosis with parenchymal  thinning consistent with chronic obstruction.  An 8 mm left distal ureteral calculus is also noted Her creatinine is minimally elevated and have recommended treatment this week We discussed various treatment options for urolithiasis including observation with or without medical expulsive therapy, shockwave lithotripsy (SWL), ureteroscopy and laser lithotripsy with stent placement, and percutaneous nephrolithotomy. We discussed that management is based on stone size, location, density, patient co-morbidities, and patient preference.  Stones <21m in size have a >80% spontaneous passage rate. Data surrounding the use of tamsulosin for medical expulsive therapy is controversial, but meta analyses suggests it is most efficacious for distal stones between 5-163min size.  SWL has a lower stone free rate in a single procedure, but also a lower complication rate compared to ureteroscopy and avoids a stent and associated stent related symptoms. Possible complications include renal hematoma, steinstrasse, and need for additional treatment. Ureteroscopy with laser lithotripsy and stent placement has a higher stone free rate than SWL in a single procedure, however increased complication rate including possible infection, ureteral injury, bleeding, and stent related morbidity. Common stent related symptoms include dysuria, urgency/frequency, and flank pain. PCNL is the favored treatment for stones >2cm. It involves a small incision in the flank, with complete fragmentation of stones and removal. It has the highest stone free rate, but also the highest complication rate. Possible complications include bleeding, infection/sepsis, injury to surrounding organs including the pleura, and collecting system injury. She does not desire PCNL and due to the multiplicity of renal calculi SWL would require multiple treatments After an extensive discussion of the risks and benefits of the above treatment options, the patient would  like to proceed with ureteroscopy. Will plan cystoscopy with bilateral ureteral stent placement and left ureteroscopy with laser lithotripsy/stone removal.  Due to stone burden on the left this may need to be a staged procedure. The contralateral side will be treated at a later date Urinalysis shows pyuria however no signs of infection and significant epithelial cells indicating vaginal contamination She was informed she could take oxycodone 1-2 tabs every 6 hours if needed for pain control   ScAbbie SonsMD  [place]Seven [place] Lakewood StreetSuMalverneuSunriver[place] [phone]

## 2022-04-14 NOTE — Progress Notes (Signed)
   [facility]  Surgery Date: Date: 04/16/2022  Surgeon: Dr. John Giovanni, MD  Inpt ( No  )   Outpt (Yes)   Obs ( No  )   Diagnosis: N20.0 Bilateral Nephrolithiasis  -CPT: 52356, [id number]  Surgery: Left Ureteroscopy with Laser Lithotripsy and Stent Placement, Right Ureteral Stent Placement   Stop Anticoagulations: N/A  Cardiac/Medical/Pulmonary Clearance needed: no  *Orders entered into EPIC  Date: 04/14/22   *Case booked in [place]  Date: 04/14/22  *Notified pt of Surgery: Date: 04/14/22  PRE-OP UA & CX: no  *Placed into Prior Authorization Work [place] Date: 04/14/22  Assistant/laser/rep:No

## 2022-04-14 NOTE — H&P (View-Only) (Signed)
04/14/2022 8:47 PM   Julie Stout 04/13/1950 [id number]  Referring provider: No referring provider defined for this encounter.  Chief Complaint  Patient presents with   Nephrolithiasis    HPI: Julie Stout is a 72 y.o. female presents for follow-up of recent ED visit for renal colic.  Acute onset 04/13/2022 LLQ abdominal pain with radiation into the left flank region.  Pain severe without identifiable precipitating, aggravating or alleviating factors Positive nausea with dry heaves.  No fever or chills ED visit secondary to severe pain.  Evaluation included UA showing >50 RBC/21-50 WBC/21-50 squamous epithelial cells CT abdomen pelvis with contrast remarkable for bilateral UPJ calculi with obstruction and numerous bilateral renal calculi.  A 8 mm left distal ureteral calculus was also noted.  Right hydronephrosis noted to be severe with parenchymal thinning suggesting chronic obstruction Creatinine mildly elevated 1.12 Pain was controlled with parenteral analgesics and she was discharged on oxycodone, ondansetron and meloxicam Since her ED visit her pain has not been severe though increase left lower quadrant pain today.  She is only taking 1 oxycodone every 8 hours 1 prior stone episode 20 years ago   PMH: Past Medical History:  Diagnosis Date   Hypertension    Obesity    Osteopenia    Pes anserinus bursitis of right knee     Surgical History: Past Surgical History:  Procedure Laterality Date   ABDOMINAL HYSTERECTOMY     CHOLECYSTECTOMY     COLONOSCOPY WITH PROPOFOL N/A 08/19/2014   Procedure: COLONOSCOPY WITH PROPOFOL;  Surgeon: Manya Silvas, MD;  Location[facility]OSCOPY;  Service: Endoscopy;  Laterality: N/A;   COLONOSCOPY WITH PROPOFOL N/A 02/01/2018   Procedure: COLONOSCOPY WITH PROPOFOL;  Surgeon: Manya Silvas, MD;  Location: [facility] ENDOSCOPY;  Service: Endoscopy;  Laterality: N/A;    Home Medications:  Allergies as of 04/14/2022   No Known  Allergies      Medication List        Accurate as of April 14, 2022  8:47 PM. If you have any questions, ask your nurse or doctor.          cholecalciferol 10 MCG (400 UNIT) Tabs tablet Commonly known as: VITAMIN D3 Take 400 Units by mouth.   hydrochlorothiazide 12.5 MG capsule Commonly known as: MICROZIDE Take by mouth.   ibuprofen 800 MG tablet Commonly known as: ADVIL Take 800 mg by mouth every 8 (eight) hours as needed.   lisinopril 20 MG tablet Commonly known as: ZESTRIL Take by mouth.   meloxicam 15 MG tablet Commonly known as: MOBIC Take 1 tablet (15 mg total) by mouth daily for 10 days.   ondansetron 4 MG disintegrating tablet Commonly known as: ZOFRAN-ODT Take 1 tablet (4 mg total) by mouth every 8 (eight) hours as needed for nausea or vomiting.   oxyCODONE-acetaminophen 5-325 MG tablet Commonly known as: PERCOCET/ROXICET Take 1 tablet by mouth every 8 (eight) hours as needed for up to 7 days.        Allergies: No Known Allergies  Family History: No family history on file.  Social History:  reports that she has never smoked. She has never used smokeless tobacco. She reports that she does not drink alcohol and does not use drugs.   Physical Exam: BP 134/83   Pulse (!) 111   Ht '5\' 1"'$  (1.549 m)   Wt 200 lb (90.7 kg)   BMI 37.79 kg/m   Constitutional:  Alert and oriented, No acute distress. HEENT: [place] AT, moist  mucus membranes.  Trachea midline, no masses. Cardiovascular: RRR. Respiratory: Normal respiratory effort, lungs clear. GI: Abdomen is soft, nontender, nondistended, no abdominal masses Psychiatric: Normal mood and affect.  Laboratory Data:  Urinalysis Microscopy 11-30 WBC/>30 RBC/>10 epis   Pertinent Imaging: CT images were personally reviewed and interpreted   Assessment & Plan:   72 y.o. female with bilateral renal pelvic calculi with bilateral hydronephrosis.  There is severe right hydronephrosis with parenchymal  thinning consistent with chronic obstruction.  An 8 mm left distal ureteral calculus is also noted Her creatinine is minimally elevated and have recommended treatment this week We discussed various treatment options for urolithiasis including observation with or without medical expulsive therapy, shockwave lithotripsy (SWL), ureteroscopy and laser lithotripsy with stent placement, and percutaneous nephrolithotomy. We discussed that management is based on stone size, location, density, patient co-morbidities, and patient preference.  Stones <56m in size have a >80% spontaneous passage rate. Data surrounding the use of tamsulosin for medical expulsive therapy is controversial, but meta analyses suggests it is most efficacious for distal stones between 5-126min size.  SWL has a lower stone free rate in a single procedure, but also a lower complication rate compared to ureteroscopy and avoids a stent and associated stent related symptoms. Possible complications include renal hematoma, steinstrasse, and need for additional treatment. Ureteroscopy with laser lithotripsy and stent placement has a higher stone free rate than SWL in a single procedure, however increased complication rate including possible infection, ureteral injury, bleeding, and stent related morbidity. Common stent related symptoms include dysuria, urgency/frequency, and flank pain. PCNL is the favored treatment for stones >2cm. It involves a small incision in the flank, with complete fragmentation of stones and removal. It has the highest stone free rate, but also the highest complication rate. Possible complications include bleeding, infection/sepsis, injury to surrounding organs including the pleura, and collecting system injury. She does not desire PCNL and due to the multiplicity of renal calculi SWL would require multiple treatments After an extensive discussion of the risks and benefits of the above treatment options, the patient would  like to proceed with ureteroscopy. Will plan cystoscopy with bilateral ureteral stent placement and left ureteroscopy with laser lithotripsy/stone removal.  Due to stone burden on the left this may need to be a staged procedure. The contralateral side will be treated at a later date Urinalysis shows pyuria however no signs of infection and significant epithelial cells indicating vaginal contamination She was informed she could take oxycodone 1-2 tabs every 6 hours if needed for pain control   ScAbbie SonsMD  [place] StreetSuParkersburguDuck Key[place] [phone]

## 2022-04-16 ENCOUNTER — Ambulatory Visit: Payer: PPO

## 2022-04-16 ENCOUNTER — Encounter: Payer: Self-pay | Admitting: Urology

## 2022-04-16 ENCOUNTER — Other Ambulatory Visit: Payer: Self-pay

## 2022-04-16 ENCOUNTER — Ambulatory Visit: Payer: PPO | Admitting: Anesthesiology

## 2022-04-16 ENCOUNTER — Encounter
Admission: RE | Disposition: A | Discharge status: Home / Self Care | Payer: Self-pay | Source: Home / Self Care | Attending: Urology

## 2022-04-16 ENCOUNTER — Ambulatory Visit
Admission: RE | Admit: 2022-04-16 | Discharge: 2022-04-16 | Disposition: A | Discharge status: Home / Self Care | Payer: PPO | Attending: Urology | Admitting: Urology

## 2022-04-16 DIAGNOSIS — Z90710 Acquired absence of both cervix and uterus: Secondary | ICD-10-CM | POA: Insufficient documentation

## 2022-04-16 DIAGNOSIS — N1330 Unspecified hydronephrosis: Secondary | ICD-10-CM

## 2022-04-16 DIAGNOSIS — Z6835 Body mass index (BMI) 35.0-35.9, adult: Secondary | ICD-10-CM | POA: Insufficient documentation

## 2022-04-16 DIAGNOSIS — Z9049 Acquired absence of other specified parts of digestive tract: Secondary | ICD-10-CM | POA: Diagnosis not present

## 2022-04-16 DIAGNOSIS — Z01818 Encounter for other preprocedural examination: Secondary | ICD-10-CM

## 2022-04-16 DIAGNOSIS — E669 Obesity, unspecified: Secondary | ICD-10-CM | POA: Insufficient documentation

## 2022-04-16 DIAGNOSIS — N202 Calculus of kidney with calculus of ureter: Secondary | ICD-10-CM | POA: Diagnosis not present

## 2022-04-16 DIAGNOSIS — I10 Essential (primary) hypertension: Secondary | ICD-10-CM | POA: Insufficient documentation

## 2022-04-16 DIAGNOSIS — N200 Calculus of kidney: Secondary | ICD-10-CM

## 2022-04-16 DIAGNOSIS — Z01810 Encounter for preprocedural cardiovascular examination: Secondary | ICD-10-CM | POA: Diagnosis not present

## 2022-04-16 DIAGNOSIS — N132 Hydronephrosis with renal and ureteral calculous obstruction: Secondary | ICD-10-CM | POA: Insufficient documentation

## 2022-04-16 HISTORY — PX: CYSTOSCOPY/URETEROSCOPY/HOLMIUM LASER/STENT PLACEMENT: SHX6546

## 2022-04-16 HISTORY — PX: CYSTOSCOPY WITH STENT PLACEMENT: SHX5790

## 2022-04-16 LAB — POCT I-STAT, CHEM 8
BUN: 24 mg/dL — ABNORMAL HIGH (ref 8–23)
Calcium, Ion: 1.18 mmol/L (ref 1.15–1.40)
Chloride: 102 mmol/L (ref 98–111)
Creatinine, Ser: 2.2 mg/dL — ABNORMAL HIGH (ref 0.44–1.00)
Glucose, Bld: 182 mg/dL — ABNORMAL HIGH (ref 70–99)
HCT: 41 % (ref 36.0–46.0)
Hemoglobin: 13.9 g/dL (ref 12.0–15.0)
Potassium: 3.8 mmol/L (ref 3.5–5.1)
Sodium: 137 mmol/L (ref 135–145)
TCO2: 23 mmol/L (ref 22–32)

## 2022-04-16 SURGERY — CYSTOSCOPY/URETEROSCOPY/HOLMIUM LASER/STENT PLACEMENT
Anesthesia: General | Site: Ureter | Laterality: Right

## 2022-04-16 MED ORDER — LIDOCAINE HCL (CARDIAC) PF 100 MG/5ML IV SOSY
PREFILLED_SYRINGE | INTRAVENOUS | Status: DC | PRN
  Administered 2022-04-16: 100 mg via INTRAVENOUS

## 2022-04-16 MED ORDER — FENTANYL CITRATE (PF) 100 MCG/2ML IJ SOLN
INTRAMUSCULAR | Status: DC | PRN
  Administered 2022-04-16: 50 ug via INTRAVENOUS

## 2022-04-16 MED ORDER — ROCURONIUM BROMIDE 100 MG/10ML IV SOLN
INTRAVENOUS | Status: DC | PRN
  Administered 2022-04-16: 50 mg via INTRAVENOUS
  Administered 2022-04-16: 10 mg via INTRAVENOUS

## 2022-04-16 MED ORDER — SODIUM CHLORIDE 0.9 % IV SOLN
1.0000 g | INTRAVENOUS | Status: AC
  Administered 2022-04-16: 1 g via INTRAVENOUS
  Filled 2022-04-16: qty 1

## 2022-04-16 MED ORDER — MIDAZOLAM HCL 2 MG/2ML IJ SOLN
INTRAMUSCULAR | Status: AC
  Filled 2022-04-16: qty 2

## 2022-04-16 MED ORDER — SUGAMMADEX SODIUM 200 MG/2ML IV SOLN
INTRAVENOUS | Status: DC | PRN
  Administered 2022-04-16 (×2): 100 mg via INTRAVENOUS

## 2022-04-16 MED ORDER — PROPOFOL 10 MG/ML IV BOLUS
INTRAVENOUS | Status: AC
  Filled 2022-04-16: qty 20

## 2022-04-16 MED ORDER — CHLORHEXIDINE GLUCONATE 0.12 % MT SOLN
OROMUCOSAL | Status: AC
  Administered 2022-04-16: 15 mL via OROMUCOSAL
  Filled 2022-04-16: qty 15

## 2022-04-16 MED ORDER — CHLORHEXIDINE GLUCONATE 0.12 % MT SOLN: 15.0000 mL | Freq: Once | OROMUCOSAL | Status: AC

## 2022-04-16 MED ORDER — TROSPIUM CHLORIDE 20 MG PO TABS: 20.0000 mg | ORAL_TABLET | Freq: Two times a day (BID) | ORAL | 1 refills | Status: DC | PRN

## 2022-04-16 MED ORDER — PROPOFOL 10 MG/ML IV BOLUS
INTRAVENOUS | Status: DC | PRN
  Administered 2022-04-16: 150 mg via INTRAVENOUS

## 2022-04-16 MED ORDER — ONDANSETRON HCL 4 MG/2ML IJ SOLN
INTRAMUSCULAR | Status: AC
  Filled 2022-04-16: qty 2

## 2022-04-16 MED ORDER — ROCURONIUM BROMIDE 10 MG/ML (PF) SYRINGE
PREFILLED_SYRINGE | INTRAVENOUS | Status: AC
  Filled 2022-04-16: qty 10

## 2022-04-16 MED ORDER — ONDANSETRON HCL 4 MG/2ML IJ SOLN: 4.0000 mg | Freq: Once | INTRAMUSCULAR | Status: DC | PRN

## 2022-04-16 MED ORDER — LACTATED RINGERS IV SOLN: INTRAVENOUS | Status: DC

## 2022-04-16 MED ORDER — STERILE WATER FOR IRRIGATION IR SOLN
Status: DC | PRN
  Administered 2022-04-16: 500 mL

## 2022-04-16 MED ORDER — ORAL CARE MOUTH RINSE: 15.0000 mL | Freq: Once | OROMUCOSAL | Status: AC

## 2022-04-16 MED ORDER — SODIUM CHLORIDE 0.9 % IR SOLN
Status: DC | PRN
  Administered 2022-04-16: 3000 mL

## 2022-04-16 MED ORDER — FENTANYL CITRATE (PF) 100 MCG/2ML IJ SOLN
INTRAMUSCULAR | Status: AC
  Filled 2022-04-16: qty 2

## 2022-04-16 MED ORDER — TAMSULOSIN HCL 0.4 MG PO CAPS: 0.4000 mg | ORAL_CAPSULE | Freq: Every day | ORAL | 0 refills | Status: DC

## 2022-04-16 MED ORDER — IOHEXOL 180 MG/ML  SOLN
INTRAMUSCULAR | Status: DC | PRN
  Administered 2022-04-16: 20 mL
  Administered 2022-04-16: 10 mL

## 2022-04-16 MED ORDER — EPHEDRINE SULFATE (PRESSORS) 50 MG/ML IJ SOLN
INTRAMUSCULAR | Status: DC | PRN
  Administered 2022-04-16: 5 mg via INTRAVENOUS
  Administered 2022-04-16 (×2): 7.5 mg via INTRAVENOUS

## 2022-04-16 MED ORDER — DEXAMETHASONE SODIUM PHOSPHATE 10 MG/ML IJ SOLN
INTRAMUSCULAR | Status: DC | PRN
  Administered 2022-04-16: 10 mg via INTRAVENOUS

## 2022-04-16 MED ORDER — DEXAMETHASONE SODIUM PHOSPHATE 10 MG/ML IJ SOLN
INTRAMUSCULAR | Status: AC
  Filled 2022-04-16: qty 1

## 2022-04-16 MED ORDER — ONDANSETRON HCL 4 MG/2ML IJ SOLN
INTRAMUSCULAR | Status: DC | PRN
  Administered 2022-04-16: 4 mg via INTRAVENOUS

## 2022-04-16 MED ORDER — LIDOCAINE HCL (PF) 2 % IJ SOLN
INTRAMUSCULAR | Status: AC
  Filled 2022-04-16: qty 5

## 2022-04-16 MED ORDER — EPHEDRINE 5 MG/ML INJ
INTRAVENOUS | Status: AC
  Filled 2022-04-16: qty 5

## 2022-04-16 MED ORDER — MIDAZOLAM HCL 2 MG/2ML IJ SOLN
INTRAMUSCULAR | Status: DC | PRN
  Administered 2022-04-16: 1 mg via INTRAVENOUS

## 2022-04-16 MED ORDER — FENTANYL CITRATE (PF) 100 MCG/2ML IJ SOLN: 25.0000 ug | INTRAMUSCULAR | Status: DC | PRN

## 2022-04-16 SURGICAL SUPPLY — 33 items
BAG DRAIN SIEMENS DORNER NS (MISCELLANEOUS) ×2 IMPLANT
BASKET ZERO TIP 1.9FR (BASKET) IMPLANT
BRUSH SCRUB EZ 1% IODOPHOR (MISCELLANEOUS) ×2 IMPLANT
CATH URET FLEX-TIP 2 LUMEN 10F (CATHETERS) IMPLANT
CATH URETL OPEN 5X70 (CATHETERS) IMPLANT
CATH URETL OPEN END 4X70 (CATHETERS) IMPLANT
CATH URETL OPEN END 6X70 (CATHETERS) IMPLANT
CNTNR URN SCR LID CUP LEK RST (MISCELLANEOUS) IMPLANT
CONT SPEC 4OZ STRL OR WHT (MISCELLANEOUS)
DRAPE UTILITY 15X26 TOWEL STRL (DRAPES) ×2 IMPLANT
FIBER LASER MOSES 200 DFL (Laser) IMPLANT
GAUZE 4X4 16PLY ~~LOC~~+RFID DBL (SPONGE) ×4 IMPLANT
GLOVE SURG UNDER POLY LF SZ7.5 (GLOVE) ×2 IMPLANT
GOWN STRL REUS W/ TWL LRG LVL3 (GOWN DISPOSABLE) ×2 IMPLANT
GOWN STRL REUS W/ TWL XL LVL3 (GOWN DISPOSABLE) ×2 IMPLANT
GOWN STRL REUS W/TWL LRG LVL3 (GOWN DISPOSABLE) ×2
GOWN STRL REUS W/TWL XL LVL3 (GOWN DISPOSABLE) ×2
GUIDEWIRE STR DUAL SENSOR (WIRE) ×2 IMPLANT
GUIDEWIRE STR ZIPWIRE 035X150 (MISCELLANEOUS) IMPLANT
IV NS IRRIG 3000ML ARTHROMATIC (IV SOLUTION) ×2 IMPLANT
KIT TURNOVER CYSTO (KITS) ×2 IMPLANT
PACK CYSTO AR (MISCELLANEOUS) ×2 IMPLANT
SET CYSTO W/LG BORE CLAMP LF (SET/KITS/TRAYS/PACK) ×2 IMPLANT
SHEATH NAVIGATOR HD 12/14X36 (SHEATH) IMPLANT
STENT PERCUFLEX 4.8FRX24 (STENTS) IMPLANT
STENT PERCUFLEX 4.8FRX28 (STENTS) IMPLANT
STENT URET 6FRX24 CONTOUR (STENTS) IMPLANT
STENT URET 6FRX26 CONTOUR (STENTS) IMPLANT
SURGILUBE 2OZ TUBE FLIPTOP (MISCELLANEOUS) ×2 IMPLANT
VALVE UROSEAL ADJ ENDO (VALVE) IMPLANT
WATER STERILE IRR 1000ML POUR (IV SOLUTION) ×2 IMPLANT
WATER STERILE IRR 500ML POUR (IV SOLUTION) ×2 IMPLANT
WIRE G XSTIFF 025X145 (WIRE) IMPLANT

## 2022-04-16 NOTE — Anesthesia Preprocedure Evaluation (Signed)
Anesthesia Evaluation  Patient identified by MRN, date of birth, ID band Patient awake    Reviewed: Allergy & Precautions, NPO status , Patient's Chart, lab work & pertinent test results  Airway Mallampati: III  TM Distance: >3 FB Neck ROM: full  Mouth opening: Limited Mouth Opening  Dental  (+) Teeth Intact   Pulmonary neg pulmonary ROS   Pulmonary exam normal breath sounds clear to auscultation       Cardiovascular Exercise Tolerance: Good hypertension, Pt. on medications negative cardio ROS Normal cardiovascular exam Rhythm:Regular Rate:Normal     Neuro/Psych negative neurological ROS  negative psych ROS   GI/Hepatic negative GI ROS, Neg liver ROS,,,  Endo/Other  negative endocrine ROS  Morbid obesity  Renal/GU negative Renal ROS     Musculoskeletal   Abdominal  (+) + obese  Peds  Hematology negative hematology ROS (+)   Anesthesia Other Findings Past Medical History: No date: Hypertension No date: Obesity No date: Osteopenia No date: Pes anserinus bursitis of right knee  Past Surgical History: No date: ABDOMINAL HYSTERECTOMY No date: CHOLECYSTECTOMY 08/19/2014: COLONOSCOPY WITH PROPOFOL; N/A     Comment:  Procedure: COLONOSCOPY WITH PROPOFOL;  Surgeon: Manya Silvas, MD;  Location: [facility] ENDOSCOPY;  Service:               Endoscopy;  Laterality: N/A; 02/01/2018: COLONOSCOPY WITH PROPOFOL; N/A     Comment:  Procedure: COLONOSCOPY WITH PROPOFOL;  Surgeon: Manya Silvas, MD;  Location: [facility] ENDOSCOPY;  Service:               Endoscopy;  Laterality: N/A;  BMI    Body Mass Index: 35.90 kg/m      Reproductive/Obstetrics negative OB ROS                             Anesthesia Physical Anesthesia Plan  ASA: 2  Anesthesia Plan: General   Post-op Pain Management:    Induction: Intravenous  PONV Risk Score and Plan: Ondansetron,  Dexamethasone, Midazolam and Treatment may vary due to age or medical condition  Airway Management Planned: Oral ETT  Additional Equipment:   Intra-op Plan:   Post-operative Plan: Extubation in OR  Informed Consent: I have reviewed the patients History and Physical, chart, labs and discussed the procedure including the risks, benefits and alternatives for the proposed anesthesia with the patient or authorized representative who has indicated his/her understanding and acceptance.     Dental Advisory Given  Plan Discussed with: CRNA and Surgeon  Anesthesia Plan Comments:        Anesthesia Quick Evaluation

## 2022-04-16 NOTE — Discharge Instructions (Addendum)
AMBULATORY SURGERY  DISCHARGE INSTRUCTIONS   The drugs that you were given will stay in your system until tomorrow so for the next 24 hours you should not:  Drive an automobile Make any legal decisions Drink any alcoholic beverage   You may resume regular meals tomorrow.  Today it is better to start with liquids and gradually work up to solid foods.  You may eat anything you prefer, but it is better to start with liquids, then soup and crackers, and gradually work up to solid foods.   Please notify your doctor immediately if you have any unusual bleeding, trouble breathing, redness and pain at the surgery site, drainage, fever, or pain not relieved by medication.    Your post-operative visit with Dr.                                       is: Date:                        Time:    Please call to schedule your post-operative visit.  Additional Instructions:  DISCHARGE INSTRUCTIONS FOR KIDNEY STONE/URETERAL STENT   MEDICATIONS:  1. Resume all your other meds from home.  2.  AZO (over-the-counter) can help with the burning/stinging when you urinate. 3.  Tamsulosin and trospium are for bladder/stent irritation, Rxs were sent to your pharmacy.  ACTIVITY:  1. May resume regular activities in 24 hours. 2. No driving while on narcotic pain medications  3. Drink plenty of water  4. Continue to walk at home - you can still get blood clots when you are at home, so keep active, but don't over do it.  5. May return to work/school tomorrow or when you feel ready    SIGNS/SYMPTOMS TO CALL:  Common postoperative symptoms include urinary frequency, urgency, bladder spasm and blood in the urine  Please call us if you have a fever greater than 101.5, uncontrolled nausea/vomiting, uncontrolled pain, dizziness, unable to urinate, excessively bloody urine, chest pain, shortness of breath, leg swelling, leg pain, or any other concerns or questions.   You can reach [place] at [phone].    FOLLOW-UP:  1. You will be contacted by our office for follow-up

## 2022-04-16 NOTE — Interval H&P Note (Signed)
History and Physical Interval Note:  04/16/2022 9:05 AM  Julie Stout  has presented today for surgery, with the diagnosis of Bilateral Hydronephrosis.  The various methods of treatment have been discussed with the patient and family. After consideration of risks, benefits and other options for treatment, the patient has consented to  Procedure(s): CYSTOSCOPY/URETEROSCOPY/HOLMIUM LASER/STENT PLACEMENT (Left) CYSTOSCOPY WITH STENT PLACEMENT (Right) as a surgical intervention.  The patient's history has been reviewed, patient examined, no change in status, stable for surgery.  I have reviewed the patient's chart and labs.  Questions were answered to the patient's satisfaction.     [place]

## 2022-04-16 NOTE — Anesthesia Procedure Notes (Signed)
Procedure Name: Intubation Date/Time: 04/16/2022 9:33 AM  Performed by: Zetta Bills, CRNAPre-anesthesia Checklist: Patient identified, Patient being monitored, Timeout performed, Emergency Drugs available and Suction available Patient Re-evaluated:Patient Re-evaluated prior to induction Oxygen Delivery Method: Circle system utilized Preoxygenation: Pre-oxygenation with 100% oxygen Induction Type: IV induction Ventilation: Mask ventilation without difficulty Laryngoscope Size: 3 and McGraph Grade View: Grade I Tube type: Oral Tube size: 7.0 mm Number of attempts: 1 Airway Equipment and Method: Stylet Placement Confirmation: ETT inserted through vocal cords under direct vision, positive ETCO2 and breath sounds checked- equal and bilateral Secured at: 21 cm Tube secured with: Tape Dental Injury: Teeth and Oropharynx as per pre-operative assessment

## 2022-04-16 NOTE — Transfer of Care (Signed)
Immediate Anesthesia Transfer of Care Note  Patient: Julie Stout  Procedure(s) Performed: CYSTOSCOPY/URETEROSCOPY/HOLMIUM LASER/STENT PLACEMENT (Left: Ureter) CYSTOSCOPY WITH STENT PLACEMENT (Right: Ureter)  Patient Location: PACU  Anesthesia Type:General  Level of Consciousness: awake  Airway & Oxygen Therapy: Patient Spontanous Breathing and Patient connected to nasal cannula oxygen  Post-op Assessment: Report given to RN  Post vital signs: stable  Last Vitals:  Vitals Value Taken Time  BP 119/69 04/16/22 1204  Temp    Pulse 92 04/16/22 1211  Resp 22 04/16/22 1211  SpO2 97 % 04/16/22 1211  Vitals shown include unvalidated device data.  Last Pain:  Vitals:   04/16/22 0750  TempSrc: Oral  PainSc: 0-No pain     Past Medical History:  Diagnosis Date   Hypertension    Obesity    Osteopenia    Pes anserinus bursitis of right knee    Past Surgical History:  Procedure Laterality Date   ABDOMINAL HYSTERECTOMY     CHOLECYSTECTOMY     COLONOSCOPY WITH PROPOFOL N/A 08/19/2014   Procedure: COLONOSCOPY WITH PROPOFOL;  Surgeon: Manya Silvas, MD;  Location: [facility] ENDOSCOPY;  Service: Endoscopy;  Laterality: N/A;   COLONOSCOPY WITH PROPOFOL N/A 02/01/2018   Procedure: COLONOSCOPY WITH PROPOFOL;  Surgeon: Manya Silvas, MD;  Location: [facility] ENDOSCOPY;  Service: Endoscopy;  Laterality: N/A;   Scheduled Meds: Continuous Infusions:  lactated ringers 10 mL/hr at 04/16/22 0919   PRN Meds:.fentaNYL (SUBLIMAZE) injection, ondansetron (ZOFRAN) IV     Complications: No notable events documented.

## 2022-04-16 NOTE — Op Note (Signed)
Preoperative diagnosis:  Bilateral renal pelvic calculi Bilateral hydronephrosis secondary to above Left distal ureteral calculus Bilateral nephrolithiasis  Postoperative diagnosis:  Bilateral renal pelvic calculi Bilateral hydronephrosis secondary to above Bilateral nephrolithiasis Passed left ureteral calculus  Procedure:  Cystoscopy Left ureteroscopy and stone removal Ureteroscopic laser lithotripsy Left ureteral stent placement (15F/24 cm)  Right ureteral stent placement (4.102F/28 cm) Bilateral retrograde pyelography with interpretation  Surgeon: Nicki Reaper C. Ayrianna Mcginniss, M.D.  Anesthesia: General  Complications: None  Intraoperative findings:  Cystoscopy-bladder mucosa normal without erythema, solid or papillary lesions Left ureteropyeloscopy-left distal ureteral calculus was no longer present, remainder ureter with normal mucosa and no strictures; dilated left renal pelvis and marked calyceal dilation.  The following stones were treated: ***  EBL: Minimal  Specimens: None   Indication: Julie Stout is a 72 y.o. presented to the ED earlier this week with left renal colic.  She was found to have bilateral renal pelvic calculi.  A 12 mm right renal pelvic calculus was associated with marked hydronephrosis and parenchymal thinning indicating longstanding obstruction.  She had no right-sided symptoms.  She had an 8 mm left renal pelvic calculus and an 8 mm left distal ureteral calculus.  There were multiple nonobstructing renal calculi in both kidneys.  After reviewing the management options for treatment, the patient elected to proceed with the above surgical procedure(s). We have discussed the potential benefits and risks of the procedure, side effects of the proposed treatment, the likelihood of the patient achieving the goals of the procedure, and any potential problems that might occur during the procedure or recuperation. Informed consent has been obtained.  Description  of procedure:  The patient was taken to the operating room and general anesthesia was induced.  The patient was placed in the dorsal lithotomy position, prepped and draped in the usual sterile fashion, and preoperative antibiotics were administered. A preoperative time-out was performed.   A 22 French cystoscope was lubricated and passed under direct vision.  The urethra was normal in caliber without stricture.  The prostate demonstrated *** lateral lobe enlargement and ***median lobe/bladder neck.  Panendoscopy was performed and the bladder mucosa showed no erythema, solid or papillary lesions.  Attention then turned to the *** ureteral orifice and a ureteral catheter was used to intubate the ureteral orifice.  Omnipaque contrast was injected through the ureteral catheter and a retrograde pyelogram was performed with findings as dictated above.  Attention was directed to the *** ureteral orifice and a 0.038 Sensor wire was then advanced up the *** ureter into the renal pelvis under fluoroscopic guidance.  The cystoscope was removed and a dual-lumen catheter was placed over the sensor wire and a second sensor wire was placed in a similar fashion.  A 12/14 French ureteral access sheath was placed over the working wire under fluoroscopic guidance without difficulty.  A digital flexible ureteroscope was placed through the access sheath and advanced into the renal pelvis without difficulty.  The calculus was identified ***.    A 273 micron holmium laser fiber was placed through the ureteroscope and the stone was dusted/fragmented at a setting of ***J and frequency of *** Hz.   All stone fragments were then removed from the collecting system with a zero tip nitinol basket.  Retrograde pyelogram was performed and each calyx was sequentially examined under fluoroscopic guidance and no significant size fragments were identified.  The ureteral access sheath and ureteroscope were removed in tandem and the  ureter showed no evidence of injury or  perforation.  The wire was then backloaded through the cystoscope and a ureteral stent was advance over the wire using Seldinger technique. A *** FR/ *** CM stent was was placed under fluoroscopic guidance.  The wire was then removed with an adequate stent curl noted in the renal pelvis as well as in the bladder.  The bladder was then emptied and the procedure ended.  The patient appeared to tolerate the procedure well and without complications.  After anesthetic reversal the patient was transported to the PACU in stable condition.

## 2022-04-16 NOTE — Anesthesia Postprocedure Evaluation (Signed)
Anesthesia Post Note  Patient: Arleatha L Lull  Procedure(s) Performed: CYSTOSCOPY/URETEROSCOPY/HOLMIUM LASER/STENT PLACEMENT (Left: Ureter) CYSTOSCOPY WITH STENT PLACEMENT (Right: Ureter)  Patient location during evaluation: PACU Anesthesia Type: General Level of consciousness: awake Pain management: pain level controlled Vital Signs Assessment: post-procedure vital signs reviewed and stable Respiratory status: spontaneous breathing Cardiovascular status: stable Anesthetic complications: no   No notable events documented.   Last Vitals:  Vitals:   04/16/22 1226 04/16/22 1249  BP: (!) 159/83 (!) 144/89  Pulse: 95 (!) 101  Resp: (!) 22 18  Temp: (!) 36.2 C 37.1 C  SpO2: 94% 93%    Last Pain:  Vitals:   04/16/22 1249  TempSrc: Temporal  PainSc:                  VAN STAVEREN,Vicke Plotner

## 2022-04-17 ENCOUNTER — Encounter: Payer: Self-pay | Admitting: Urology

## 2022-04-19 ENCOUNTER — Other Ambulatory Visit: Payer: Self-pay

## 2022-04-19 NOTE — Telephone Encounter (Signed)
Patient called asking if Meloxicam can be refilled. She has 3 pills left. Some days she has to take Oxycodone and Meloxicam every 6 hours and is running out. Still has some pain in the left kidney area off and on.   Medication pulled for review

## 2022-04-20 MED ORDER — MELOXICAM 15 MG PO TABS: 15.0000 mg | ORAL_TABLET | Freq: Every day | ORAL | 0 refills | Status: AC

## 2022-04-22 ENCOUNTER — Telehealth: Payer: Self-pay | Admitting: *Deleted

## 2022-04-22 NOTE — Telephone Encounter (Signed)
Pt calling asking for refill of percocet that was filled in the ER. Pt states she have 5 tablets left and didn't want to go thru the weekend without them. Pt also states she haven't had to take one since earlier this week. Please advise

## 2022-04-23 ENCOUNTER — Telehealth: Payer: Self-pay

## 2022-04-23 ENCOUNTER — Other Ambulatory Visit: Payer: Self-pay | Admitting: Urology

## 2022-04-23 DIAGNOSIS — N200 Calculus of kidney: Secondary | ICD-10-CM

## 2022-04-23 MED ORDER — OXYCODONE-ACETAMINOPHEN 5-325 MG PO TABS: 1.0000 | ORAL_TABLET | Freq: Three times a day (TID) | ORAL | 0 refills | Status: DC | PRN

## 2022-04-23 NOTE — Telephone Encounter (Signed)
I spoke with Mrs. Lumm. We have discussed possible surgery dates and Tuesday March 19th, 2024 was agreed upon by all parties. Patient given information about surgery date, what to expect pre-operatively and post operatively.  We discussed that a Pre-Admission Testing office will be calling to set up the pre-op visit that will take place prior to surgery, and that these appointments are typically done over the phone with a Pre-Admissions RN. Informed patient that our office will communicate any additional care to be provided after surgery. Patients questions or concerns were discussed during our call. Advised to call our office should there be any additional information, questions or concerns that arise. Patient verbalized understanding.

## 2022-04-23 NOTE — Progress Notes (Signed)
   [facility]  Surgery Date: Date: 05/11/2022  Surgeon: Dr. John Giovanni, MD  Inpt ( No  )   Outpt (Yes)   Obs ( No  )   Diagnosis: N20.0 Bilateral Nephrolithiasis  -CPT: 52356  Surgery: Bilateral Ureteroscopy with Laser Lithotripsy and Stent Exchange  Stop Anticoagulations: N/A  Cardiac/Medical/Pulmonary Clearance needed: no  *Orders entered into EPIC  Date: 04/23/22   *Case booked in [place]  Date: 04/23/22  *Notified pt of Surgery: Date: 04/23/22  PRE-OP UA & CX: Yes, will obtain in clinic on 04/30/2022  *Placed into Prior Authorization Work Fabio Bering Date: 04/23/22  Assistant/laser/rep:No

## 2022-04-23 NOTE — Progress Notes (Signed)
Surgical Physician Order Form [facility]  * Scheduling expectation : Next Available  *Length of Case: Will definitely need 2.5-3 hours  *Clearance needed: no  *Anticoagulation Instructions: N/A  *Aspirin Instructions: N/A  *Post-op visit Date/Instructions:   TBD  *Diagnosis:  Bilateral nephrolithiasis  *Procedure:  Bilateral   Ureteroscopy w/laser lithotripsy & stent exchange [id number])   Additional orders: N/A  -Admit type: OUTpatient  -Anesthesia: General  -VTE Prophylaxis Standing Order SCD's       Other:   -Standing Lab Orders Per Anesthesia    Lab other: UA&Urine Culture  -Standing Test orders EKG/Chest x-ray per Anesthesia       Test other:   - Medications:  Ceftriaxone(Rocephin) 1gm IV  -Other orders:  N/A

## 2022-04-24 ENCOUNTER — Emergency Department
Admission: EM | Admit: 2022-04-24 | Discharge: 2022-04-24 | Disposition: A | Discharge status: Home / Self Care | Payer: PPO | Attending: Emergency Medicine | Admitting: Emergency Medicine

## 2022-04-24 ENCOUNTER — Other Ambulatory Visit: Payer: Self-pay

## 2022-04-24 ENCOUNTER — Emergency Department: Payer: PPO

## 2022-04-24 DIAGNOSIS — K5900 Constipation, unspecified: Secondary | ICD-10-CM | POA: Diagnosis not present

## 2022-04-24 NOTE — Discharge Instructions (Addendum)
Please use MiraLAX one half capful every hour until your first bowel movement.  Please do not take any MiraLAX after this for at least 24 hours.  You may use one half capful twice a day of MiraLAX in order to have 1 solid well-formed bowel movement per day.  You may increase or decrease this dosage as needed to obtain this 1 well-formed bowel movement.  Please make sure that you are drinking at least 8 ounces of water every hour during this initial bowel regimen. ?

## 2022-04-24 NOTE — ED Notes (Signed)
Patient states she has taken stool softeners and 1/2 bottle of mag citrate yesterday with no results.

## 2022-04-24 NOTE — ED Notes (Addendum)
Patient states she stopped Percocet and Meloxicam  4 days ago and stopped Flomax and Trospium for 3 days. Patient feels that the medications were making her "too dry."

## 2022-04-24 NOTE — ED Triage Notes (Addendum)
Pt here with constipation. Pt also states she is dehydrated. Pt states she had kidney surgery last week and has been taking her prescribed pain medication. Pt last bowel movement was 2 weeks ago. Pt denies NVD.

## 2022-04-24 NOTE — ED Provider Notes (Signed)
[facility] Provider Note   Event Date/Time   First MD Initiated Contact with Patient 04/24/22 1234     (approximate) History  Constipation  HPI Julie Stout is a 72 y.o. female who presents for constipation after recently undergoing stent placement for bilateral renal calculi.  Patient states that she has been unable to have a solid bowel movement for the last 2 weeks.  Patient states that she has been taking narcotic pain medicine intermittently due to her flank pain.  Patient has tried taking half a bottle of magnesium citrate with no improvement of her symptoms.  Patient describes a discomfort in her abdomen that occasionally radiates across the midline or down the left side.  Patient denies any exacerbating or relieving factors ROS: Patient currently denies any vision changes, tinnitus, difficulty speaking, facial droop, sore throat, chest pain, shortness of breath, nausea/vomiting/diarrhea, dysuria, or weakness/numbness/paresthesias in any extremity   Physical Exam  Triage Vital Signs: ED Triage Vitals  Enc Vitals Group     BP 04/24/22 1133 (!) 149/79     Pulse Rate 04/24/22 1133 (!) 106     Resp 04/24/22 1133 16     Temp 04/24/22 1133 98 F (36.7 C)     Temp Source 04/24/22 1133 Oral     SpO2 04/24/22 1133 94 %     Weight 04/24/22 1134 190 lb 0.6 oz (86.2 kg)     Height 04/24/22 1134 '5\' 1"'$  (1.549 m)     Head Circumference --      Peak Flow --      Pain Score 04/24/22 1134 2     Pain Loc --      Pain Edu? --      Excl. in [place]? --    Most recent vital signs: Vitals:   04/24/22 1133  BP: (!) 149/79  Pulse: (!) 106  Resp: 16  Temp: 98 F (36.7 C)  SpO2: 94%   General: Awake, oriented x4. CV:  Good peripheral perfusion.  Resp:  Normal effort.  Abd:  No distention.  Other:  Elderly obese Caucasian female laying in bed in no acute distress ED Results / Procedures / Treatments  Labs (all labs ordered are listed, but only abnormal results  are displayed) Labs Reviewed - No data to display RADIOLOGY ED MD interpretation: Single view portable x-ray of the abdomen shows bilateral renal stones with a 1.5 cm stone in the proximal right ureter adjacent to the ureteral stent as well as ureteral stent being in good position.  Patient has mild to moderate fecal loading on the ascending and descending colon -Agree with radiology assessment Official radiology report(s): DG Abdomen 1 View  Result Date: 04/24/2022 CLINICAL DATA:  Constipation EXAM: ABDOMEN - 1 VIEW COMPARISON:  CT scan of the abdomen and pelvis April 13, 2022 FINDINGS: Bilateral renal stones are identified, left greater than right. There is a stone in the proximal right ureter measuring 1.5 cm in cranial caudal dimension, adjacent to ureteral stent. Two calcifications adjacent to the distal right ureteral stent appear to correlate with phleboliths identified on previous CT imaging. A left ureteral stent is also noted in good position with no stones seen along the course of the stent. Mild to moderate fecal loading involving the ascending and descending colon. No bowel obstruction. IMPRESSION: 1. Bilateral renal stones. 2. 1.5 cm stone in the proximal right ureter adjacent to the ureteral stent. 3. Left ureteral stent in good position. 4. Mild to moderate fecal loading  in the ascending and descending colon. Electronically Signed   By: Dorise Bullion III M.D.   On: 04/24/2022 11:57   PROCEDURES: Critical Care performed: No Procedures MEDICATIONS ORDERED IN ED: Medications - No data to display IMPRESSION / MDM / [place] / ED COURSE  I reviewed the triage vital signs and the nursing notes.                              Patient's presentation is most consistent with acute presentation with potential threat to life or bodily function. Patients history and exam most consistent with constipation as an etiology for their pain.  Patients symptoms not typical for other  emergent causes of abdominal pain such as, but not limited to, appendicitis, abdominal aortic aneurysm, pancreatitis, SBO, mesenteric ischemia, serious intra-abdominal bacterial illness.  Patient without red flags concerning for cancer as a constipation etiology.  Rx: Miralax  Disposition:  Patient will be discharged with strict return precautions and follow up with primary MD within 24-48 hours for further evaluation. Patient understands that this still may have an early presentation of an emergent medical condition such as appendicitis that will require a recheck.   FINAL CLINICAL IMPRESSION(S) / ED DIAGNOSES   Final diagnoses:  Constipation, unspecified constipation type   Rx / DC Orders   ED Discharge Orders     None      Note:  This document was prepared using Dragon voice recognition software and may include unintentional dictation errors.   Naaman Plummer, MD 04/24/22 [phone]

## 2022-04-25 ENCOUNTER — Encounter: Payer: Self-pay | Admitting: Emergency Medicine

## 2022-04-25 ENCOUNTER — Other Ambulatory Visit: Payer: Self-pay

## 2022-04-25 ENCOUNTER — Emergency Department
Admission: EM | Admit: 2022-04-25 | Discharge: 2022-04-25 | Disposition: A | Discharge status: Home / Self Care | Payer: PPO | Attending: Emergency Medicine | Admitting: Emergency Medicine

## 2022-04-25 DIAGNOSIS — K5903 Drug induced constipation: Secondary | ICD-10-CM

## 2022-04-25 DIAGNOSIS — K5900 Constipation, unspecified: Secondary | ICD-10-CM | POA: Diagnosis present

## 2022-04-25 DIAGNOSIS — K5641 Fecal impaction: Secondary | ICD-10-CM | POA: Insufficient documentation

## 2022-04-25 MED ORDER — GLYCERIN (ADULT) 2 G RE SUPP: 1.0000 | RECTAL | 0 refills | Status: DC | PRN

## 2022-04-25 NOTE — ED Notes (Signed)
Supplies in room that were requested by MD for disimpaction.

## 2022-04-25 NOTE — ED Triage Notes (Signed)
Pt via POV from home. Pt states she was seen yesterday for constipation. No enema was given during her time in the ER but was giving OTC miralax at home. States she did as instructed but has not had any relief. Pt also believes now she is developing a hemorrhoid. Last BM was 2 weeks ago. Pt is A&OX4 and NAD

## 2022-04-25 NOTE — Discharge Instructions (Addendum)
Continue to take MiraLAX as prescribed until you are able to make several good full bowel movements.  After that you can slow down and take MiraLAX 1 capful twice a day  If you are unable to make a bowel movement in the next 24 hours, take a glycerin suppository in the afternoon tomorrow.  You have any increased pain, severe pain, migration of pain, fevers, or new or worsening symptoms at all come back to the emergency department to check for other causes of your pain.

## 2022-04-25 NOTE — ED Provider Notes (Signed)
[facility] Provider Note    Event Date/Time   First MD Initiated Contact with Patient 04/25/22 Julie Stout     (approximate)   History   Constipation   HPI  Julie Stout is a 72 y.o. female   Past medical history of urologic procedure on 04/16/2022 for bilateral nephrolithiasis with lithotripsy and stent placement who was put on narcotic pain medications at that time and has not had a bowel movement for 2 weeks here for constipation.  Was seen in the emergency department yesterday with constipation and was asked to take MiraLAX she has taken multiple doses over the last 24 hours but still has not had a bowel movement.  Passing flatus.  She does not have pain but does notice some distention in her abdomen.  She has not been nauseous or vomiting.  SHe has no other acute medical complaints.  She has a sensation of fullness near her rectum and was able to palpate a hard stool on the rectal verge.  Independent Historian contributed to assessment above: Daughter at bedside  External Medical Documents Reviewed: Emergency department note dated yesterday 04/24/2022 for constipation.      Physical Exam   Triage Vital Signs: ED Triage Vitals  Enc Vitals Group     BP 04/25/22 1749 (!) 115/55     Pulse Rate 04/25/22 1749 95     Resp 04/25/22 1748 20     Temp 04/25/22 1748 98.6 F (37 C)     Temp Source 04/25/22 1748 Oral     SpO2 04/25/22 1748 96 %     Weight 04/25/22 1746 190 lb (86.2 kg)     Height 04/25/22 1746 '5\' 2"'$  (1.575 m)     Head Circumference --      Peak Flow --      Pain Score 04/25/22 1746 4     Pain Loc --      Pain Edu? --      Excl. in [place]? --     Most recent vital signs: Vitals:   04/25/22 1748 04/25/22 1749  BP:  (!) 115/55  Pulse:  95  Resp: 20   Temp: 98.6 F (37 C)   SpO2: 96%     General: Awake, no distress.  CV:  Good peripheral perfusion.  Resp:  Normal effort.  Abd:  No distention.  Other:  Awake alert comfortable  nontoxic with normal vital signs and mildly distended but nontender abdomen to deep palpation.  Rectal exam shows large hard stool ball at the rectal verge see note for disimpaction   ED Results / Procedures / Treatments   Labs (all labs ordered are listed, but only abnormal results are displayed) Labs Reviewed - No data to display   PROCEDURES:  Critical Care performed: No  Fecal disimpaction  Date/Time: 04/25/2022 7:07 PM  Performed by: Lucillie Garfinkel, MD Authorized by: Lucillie Garfinkel, MD  Consent: Verbal consent obtained. Consent given by: patient Patient understanding: patient states understanding of the procedure being performed Patient consent: the patient's understanding of the procedure matches consent given Patient identity confirmed: verbally with patient Time out: Immediately prior to procedure a "time out" was called to verify the correct patient, procedure, equipment, support staff and site/side marked as required. Preparation: Patient was prepped and draped in the usual sterile fashion. Local anesthesia used: no  Anesthesia: Local anesthesia used: no  Sedation: Patient sedated: no  Patient tolerance: patient tolerated the procedure well with no immediate complications  MEDICATIONS ORDERED IN ED: Medications - No data to display   IMPRESSION / MDM / [place] / ED COURSE  I reviewed the triage vital signs and the nursing notes.                                Patient's presentation is most consistent with acute presentation with potential threat to life or bodily function.  Differential diagnosis includes, but is not limited to, constipation, fecal impaction, considered but less likely intra-abdominal infection obstruction sepsis   The patient is on the cardiac monitor to evaluate for evidence of arrhythmia and/or significant heart rate changes.  MDM: Constipation only as chief medical complaint no pain, there is a bloating distention  sensation but benign abdominal exam.  Fullness at the rectal verge with hard stool ball that was manually disimpacted tolerated well large amount of stool hard removed patient feels better already.  Can continue with MiraLAX and given a prescription for glycerin suppository to aid in her constipation as needed to take tomorrow. I doubt other surgical abdominal pathologies at this time given benign exam as above and relief with disimpaction.  Defer further lab testing or imaging at this time.  Gave strict return precautions for any severe pain, localized symptoms, nausea vomiting, bleeding, fevers or any other new or worsening symptoms she should come back to the emergency department.         FINAL CLINICAL IMPRESSION(S) / ED DIAGNOSES   Final diagnoses:  Drug-induced constipation  Fecal impaction in rectum [facility])     Rx / DC Orders   ED Discharge Orders          Ordered    glycerin adult 2 g suppository  As needed        04/25/22 1934             Note:  This document was prepared using Dragon voice recognition software and may include unintentional dictation errors.    Lucillie Garfinkel, MD 04/25/22 [phone]

## 2022-04-30 ENCOUNTER — Other Ambulatory Visit: Payer: PPO

## 2022-04-30 DIAGNOSIS — N200 Calculus of kidney: Secondary | ICD-10-CM

## 2022-04-30 LAB — URINALYSIS, COMPLETE
Bilirubin, UA: NEGATIVE
Glucose, UA: NEGATIVE
Ketones, UA: NEGATIVE
Nitrite, UA: NEGATIVE
Specific Gravity, UA: 1.02 (ref 1.005–1.030)
Urobilinogen, Ur: 0.2 mg/dL (ref 0.2–1.0)
pH, UA: 7 (ref 5.0–7.5)

## 2022-04-30 LAB — MICROSCOPIC EXAMINATION
Epithelial Cells (non renal): >10 /hpf — AB (ref 0–10)
RBC, Urine: >30 /hpf — AB (ref 0–2)

## 2022-05-03 ENCOUNTER — Encounter
Admission: RE | Admit: 2022-05-03 | Discharge: 2022-05-03 | Disposition: A | Discharge status: Home / Self Care | Payer: PPO | Source: Ambulatory Visit | Attending: Urology | Admitting: Urology

## 2022-05-03 LAB — CULTURE, URINE COMPREHENSIVE

## 2022-05-03 NOTE — Patient Instructions (Signed)
Your procedure is scheduled on:05-11-22 Tuesday Report to the Registration Desk on the 1st floor of the [facility]. To find out your arrival time, please call [phone] between 1PM - 3PM on:05-10-22 Monday  If your arrival time is 6:00 am, do not arrive before that time as the [facility] entrance doors do not open until 6:00 am.  REMEMBER: Instructions that are not followed completely may result in serious medical risk, up to and including death; or upon the discretion of your surgeon and anesthesiologist your surgery may need to be rescheduled.  Do not eat food OR drink any liquids after midnight the night before surgery.  No gum chewing or hard candies.  One week prior to surgery:Last dose will be 05-03-22 Stop Anti-inflammatories (NSAIDS) such as Advil, Aleve, Ibuprofen, Motrin, Naproxen, Naprosyn and Aspirin based products such as Excedrin, Goody's Powder, BC Powder.You may however, continue to take Tylenol/Percocet if needed for pain up until the day of surgery.  Stop ANY OVER THE COUNTER supplements/vitamins NOW (05-03-22) until after surgery.  TAKE ONLY THESE MEDICATIONS THE MORNING OF SURGERY WITH A SIP OF WATER: -You may take oxyCODONE-acetaminophen (PERCOCET/ROXICET) for pain if needed  Continue your lisinopril (PRINIVIL,ZESTRIL) up until the day prior to surgery-Do NOT take AM of surgery  No Alcohol for 24 hours before or after surgery.  No Smoking including e-cigarettes for 24 hours before surgery.  No chewable tobacco products for at least 6 hours before surgery.  No nicotine patches on the day of surgery.  Do not use any "recreational" drugs for at least a week (preferably 2 weeks) before your surgery.  Please be advised that the combination of cocaine and anesthesia may have negative outcomes, up to and including death. If you test positive for cocaine, your surgery will be cancelled.  On the morning of surgery brush your teeth with toothpaste and water, you may  rinse your mouth with mouthwash if you wish. Do not swallow any toothpaste or mouthwash.  Do not wear jewelry, make-up, hairpins, clips or nail polish.  Do not wear lotions, powders, or perfumes.   Do not shave body hair from the neck down 48 hours before surgery.  Contact lenses, hearing aids and dentures may not be worn into surgery.  Do not bring valuables to the hospital. [facility] is not responsible for any missing/lost belongings or valuables.   Notify your doctor if there is any change in your medical condition (cold, fever, infection).  Wear comfortable clothing (specific to your surgery type) to the hospital.  After surgery, you can help prevent lung complications by doing breathing exercises.  Take deep breaths and cough every 1-2 hours. Your doctor may order a device called an Incentive Spirometer to help you take deep breaths. When coughing or sneezing, hold a pillow firmly against your incision with both hands. This is called "splinting." Doing this helps protect your incision. It also decreases belly discomfort.  If you are being admitted to the hospital overnight, leave your suitcase in the car. After surgery it may be brought to your room.  In case of increased patient census, it may be necessary for you, the patient, to continue your postoperative care in the Same Day Surgery department.  If you are being discharged the day of surgery, you will not be allowed to drive home. You will need a responsible individual to drive you home and stay with you for 24 hours after surgery.   If you are taking public transportation, you will need  to have a responsible individual with you.  Please call the [facility] Dept. at [phone] if you have any questions about these instructions.  Surgery Visitation Policy:  Patients undergoing a surgery or procedure may have two family members or support persons with them as long as the person is not COVID-19 positive or  experiencing its symptoms.   Due to an increase in RSV and influenza rates and associated hospitalizations, children ages 81 and under will not be able to visit patients in [facility]. Masks continue to be strongly recommended.

## 2022-05-11 ENCOUNTER — Other Ambulatory Visit: Payer: Self-pay

## 2022-05-11 ENCOUNTER — Ambulatory Visit
Admission: RE | Admit: 2022-05-11 | Discharge: 2022-05-11 | Disposition: A | Discharge status: Home / Self Care | Payer: PPO | Attending: Urology | Admitting: Urology

## 2022-05-11 ENCOUNTER — Encounter
Admission: RE | Disposition: A | Discharge status: Home / Self Care | Payer: Self-pay | Source: Home / Self Care | Attending: Urology

## 2022-05-11 ENCOUNTER — Ambulatory Visit: Payer: PPO | Admitting: Anesthesiology

## 2022-05-11 ENCOUNTER — Encounter: Payer: Self-pay | Admitting: Urology

## 2022-05-11 ENCOUNTER — Ambulatory Visit: Payer: PPO

## 2022-05-11 ENCOUNTER — Ambulatory Visit: Payer: PPO | Admitting: Urgent Care

## 2022-05-11 DIAGNOSIS — N200 Calculus of kidney: Secondary | ICD-10-CM

## 2022-05-11 DIAGNOSIS — Z9049 Acquired absence of other specified parts of digestive tract: Secondary | ICD-10-CM | POA: Insufficient documentation

## 2022-05-11 DIAGNOSIS — Z6834 Body mass index (BMI) 34.0-34.9, adult: Secondary | ICD-10-CM | POA: Diagnosis not present

## 2022-05-11 DIAGNOSIS — E669 Obesity, unspecified: Secondary | ICD-10-CM | POA: Diagnosis not present

## 2022-05-11 DIAGNOSIS — N132 Hydronephrosis with renal and ureteral calculous obstruction: Secondary | ICD-10-CM | POA: Insufficient documentation

## 2022-05-11 DIAGNOSIS — N1330 Unspecified hydronephrosis: Secondary | ICD-10-CM | POA: Diagnosis not present

## 2022-05-11 DIAGNOSIS — I10 Essential (primary) hypertension: Secondary | ICD-10-CM | POA: Insufficient documentation

## 2022-05-11 DIAGNOSIS — Z90710 Acquired absence of both cervix and uterus: Secondary | ICD-10-CM | POA: Insufficient documentation

## 2022-05-11 DIAGNOSIS — G4730 Sleep apnea, unspecified: Secondary | ICD-10-CM | POA: Diagnosis not present

## 2022-05-11 DIAGNOSIS — M8580 Other specified disorders of bone density and structure, unspecified site: Secondary | ICD-10-CM | POA: Insufficient documentation

## 2022-05-11 HISTORY — PX: CYSTOSCOPY/URETEROSCOPY/HOLMIUM LASER/STENT PLACEMENT: SHX6546

## 2022-05-11 LAB — GLUCOSE, CAPILLARY
Glucose-Capillary: 177 mg/dL — ABNORMAL HIGH (ref 70–99)
Glucose-Capillary: 197 mg/dL — ABNORMAL HIGH (ref 70–99)

## 2022-05-11 SURGERY — CYSTOSCOPY/URETEROSCOPY/HOLMIUM LASER/STENT PLACEMENT
Anesthesia: General | Laterality: Bilateral

## 2022-05-11 MED ORDER — FAMOTIDINE 20 MG PO TABS
ORAL_TABLET | ORAL | Status: AC
  Administered 2022-05-11: 20 mg via ORAL
  Filled 2022-05-11: qty 1

## 2022-05-11 MED ORDER — OXYCODONE HCL 5 MG/5ML PO SOLN: 5.0000 mg | Freq: Once | ORAL | Status: DC | PRN

## 2022-05-11 MED ORDER — FENTANYL CITRATE (PF) 100 MCG/2ML IJ SOLN: 25.0000 ug | INTRAMUSCULAR | Status: DC | PRN

## 2022-05-11 MED ORDER — PROPOFOL 10 MG/ML IV BOLUS
INTRAVENOUS | Status: AC
  Filled 2022-05-11: qty 20

## 2022-05-11 MED ORDER — SODIUM CHLORIDE 0.9 % IR SOLN
Status: DC | PRN
  Administered 2022-05-11 (×2): 3000 mL via INTRAVESICAL

## 2022-05-11 MED ORDER — SUGAMMADEX SODIUM 200 MG/2ML IV SOLN
INTRAVENOUS | Status: DC | PRN
  Administered 2022-05-11: 50 mg via INTRAVENOUS
  Administered 2022-05-11: 350 mg via INTRAVENOUS

## 2022-05-11 MED ORDER — LACTATED RINGERS IV SOLN: INTRAVENOUS | Status: DC | PRN

## 2022-05-11 MED ORDER — FAMOTIDINE 20 MG PO TABS: 20.0000 mg | ORAL_TABLET | Freq: Once | ORAL | Status: AC

## 2022-05-11 MED ORDER — SEVOFLURANE IN SOLN
RESPIRATORY_TRACT | Status: AC
  Filled 2022-05-11: qty 250

## 2022-05-11 MED ORDER — EPHEDRINE 5 MG/ML INJ
INTRAVENOUS | Status: AC
  Filled 2022-05-11: qty 5

## 2022-05-11 MED ORDER — DEXAMETHASONE SODIUM PHOSPHATE 10 MG/ML IJ SOLN
INTRAMUSCULAR | Status: DC | PRN
  Administered 2022-05-11: 5 mg via INTRAVENOUS

## 2022-05-11 MED ORDER — SODIUM CHLORIDE 0.9 % IV SOLN
1.0000 g | INTRAVENOUS | Status: AC
  Administered 2022-05-11: 1 g via INTRAVENOUS
  Filled 2022-05-11: qty 1

## 2022-05-11 MED ORDER — ROCURONIUM BROMIDE 100 MG/10ML IV SOLN
INTRAVENOUS | Status: DC | PRN
  Administered 2022-05-11: 50 mg via INTRAVENOUS
  Administered 2022-05-11 (×2): 10 mg via INTRAVENOUS

## 2022-05-11 MED ORDER — FENTANYL CITRATE (PF) 100 MCG/2ML IJ SOLN
INTRAMUSCULAR | Status: DC | PRN
  Administered 2022-05-11 (×2): 50 ug via INTRAVENOUS

## 2022-05-11 MED ORDER — ROCURONIUM BROMIDE 10 MG/ML (PF) SYRINGE
PREFILLED_SYRINGE | INTRAVENOUS | Status: AC
  Filled 2022-05-11: qty 10

## 2022-05-11 MED ORDER — IOHEXOL 180 MG/ML  SOLN
INTRAMUSCULAR | Status: DC | PRN
  Administered 2022-05-11: 20 mL
  Administered 2022-05-11: 10 mL

## 2022-05-11 MED ORDER — DEXAMETHASONE SODIUM PHOSPHATE 10 MG/ML IJ SOLN
INTRAMUSCULAR | Status: AC
  Filled 2022-05-11: qty 1

## 2022-05-11 MED ORDER — ONDANSETRON HCL 4 MG/2ML IJ SOLN
INTRAMUSCULAR | Status: AC
  Filled 2022-05-11: qty 2

## 2022-05-11 MED ORDER — SODIUM CHLORIDE 0.9 % IV SOLN: INTRAVENOUS | Status: DC

## 2022-05-11 MED ORDER — SILODOSIN 8 MG PO CAPS: 8.0000 mg | ORAL_CAPSULE | Freq: Every day | ORAL | 0 refills | Status: DC

## 2022-05-11 MED ORDER — ORAL CARE MOUTH RINSE: 15.0000 mL | Freq: Once | OROMUCOSAL | Status: AC

## 2022-05-11 MED ORDER — LIDOCAINE HCL (CARDIAC) PF 100 MG/5ML IV SOSY
PREFILLED_SYRINGE | INTRAVENOUS | Status: DC | PRN
  Administered 2022-05-11: 40 mg via INTRAVENOUS

## 2022-05-11 MED ORDER — LIDOCAINE HCL (PF) 2 % IJ SOLN
INTRAMUSCULAR | Status: AC
  Filled 2022-05-11: qty 5

## 2022-05-11 MED ORDER — ACETAMINOPHEN 10 MG/ML IV SOLN
INTRAVENOUS | Status: AC
  Filled 2022-05-11: qty 100

## 2022-05-11 MED ORDER — SOLIFENACIN SUCCINATE 10 MG PO TABS: 10.0000 mg | ORAL_TABLET | Freq: Every day | ORAL | 0 refills | Status: DC

## 2022-05-11 MED ORDER — OXYCODONE HCL 5 MG PO TABS: 5.0000 mg | ORAL_TABLET | Freq: Once | ORAL | Status: DC | PRN

## 2022-05-11 MED ORDER — ONDANSETRON HCL 4 MG/2ML IJ SOLN
INTRAMUSCULAR | Status: DC | PRN
  Administered 2022-05-11: 4 mg via INTRAVENOUS

## 2022-05-11 MED ORDER — EPHEDRINE SULFATE (PRESSORS) 50 MG/ML IJ SOLN
INTRAMUSCULAR | Status: DC | PRN
  Administered 2022-05-11: 10 mg via INTRAVENOUS
  Administered 2022-05-11: 5 mg via INTRAVENOUS
  Administered 2022-05-11: 10 mg via INTRAVENOUS

## 2022-05-11 MED ORDER — PHENYLEPHRINE 80 MCG/ML (10ML) SYRINGE FOR IV PUSH (FOR BLOOD PRESSURE SUPPORT)
PREFILLED_SYRINGE | INTRAVENOUS | Status: AC
  Filled 2022-05-11: qty 10

## 2022-05-11 MED ORDER — FENTANYL CITRATE (PF) 100 MCG/2ML IJ SOLN
INTRAMUSCULAR | Status: AC
  Filled 2022-05-11: qty 2

## 2022-05-11 MED ORDER — CHLORHEXIDINE GLUCONATE 0.12 % MT SOLN
OROMUCOSAL | Status: AC
  Administered 2022-05-11: 15 mL via OROMUCOSAL
  Filled 2022-05-11: qty 15

## 2022-05-11 MED ORDER — PROPOFOL 10 MG/ML IV BOLUS
INTRAVENOUS | Status: DC | PRN
  Administered 2022-05-11: 150 mg via INTRAVENOUS

## 2022-05-11 MED ORDER — ACETAMINOPHEN 10 MG/ML IV SOLN
INTRAVENOUS | Status: DC | PRN
  Administered 2022-05-11: 1000 mg via INTRAVENOUS

## 2022-05-11 MED ORDER — PHENYLEPHRINE HCL (PRESSORS) 10 MG/ML IV SOLN
INTRAVENOUS | Status: DC | PRN
  Administered 2022-05-11 (×3): 80 ug via INTRAVENOUS

## 2022-05-11 MED ORDER — CHLORHEXIDINE GLUCONATE 0.12 % MT SOLN: 15.0000 mL | Freq: Once | OROMUCOSAL | Status: AC

## 2022-05-11 SURGICAL SUPPLY — 27 items
BAG DRAIN SIEMENS DORNER NS (MISCELLANEOUS) ×1 IMPLANT
BAG DRN NS LF (MISCELLANEOUS) ×1
BASKET ZERO TIP 1.9FR (BASKET) IMPLANT
BRUSH SCRUB EZ 1% IODOPHOR (MISCELLANEOUS) ×1 IMPLANT
BSKT STON RTRVL ZERO TP 1.9FR (BASKET) ×1
CATH URET FLEX-TIP 2 LUMEN 10F (CATHETERS) IMPLANT
CATH URETL OPEN END 6X70 (CATHETERS) IMPLANT
CNTNR URN SCR LID CUP LEK RST (MISCELLANEOUS) IMPLANT
CONT SPEC 4OZ STRL OR WHT (MISCELLANEOUS) ×1
DRAPE UTILITY 15X26 TOWEL STRL (DRAPES) ×1 IMPLANT
FIBER LASER MOSES 200 DFL (Laser) IMPLANT
GLOVE BIOGEL PI IND STRL 7.5 (GLOVE) ×1 IMPLANT
GOWN STRL REUS W/ TWL LRG LVL3 (GOWN DISPOSABLE) ×1 IMPLANT
GOWN STRL REUS W/ TWL XL LVL3 (GOWN DISPOSABLE) ×1 IMPLANT
GOWN STRL REUS W/TWL LRG LVL3 (GOWN DISPOSABLE) ×1
GOWN STRL REUS W/TWL XL LVL3 (GOWN DISPOSABLE) ×1
GUIDEWIRE STR DUAL SENSOR (WIRE) ×1 IMPLANT
IV NS IRRIG 3000ML ARTHROMATIC (IV SOLUTION) ×1 IMPLANT
KIT TURNOVER CYSTO (KITS) ×1 IMPLANT
PACK CYSTO AR (MISCELLANEOUS) ×1 IMPLANT
SET CYSTO W/LG BORE CLAMP LF (SET/KITS/TRAYS/PACK) ×1 IMPLANT
SHEATH NAVIGATOR HD 12/14X36 (SHEATH) IMPLANT
STENT URET 6FRX24 CONTOUR (STENTS) IMPLANT
STENT URET 6FRX26 CONTOUR (STENTS) IMPLANT
SURGILUBE 2OZ TUBE FLIPTOP (MISCELLANEOUS) ×1 IMPLANT
VALVE UROSEAL ADJ ENDO (VALVE) IMPLANT
WATER STERILE IRR 500ML POUR (IV SOLUTION) ×1 IMPLANT

## 2022-05-11 NOTE — Transfer of Care (Signed)
Immediate Anesthesia Transfer of Care Note  Patient: Julie Stout  Procedure(s) Performed: CYSTOSCOPY/URETEROSCOPY/HOLMIUM LASER/STENT EXCHANGE (Bilateral)  Patient Location: PACU  Anesthesia Type:General  Level of Consciousness: drowsy and patient cooperative  Airway & Oxygen Therapy: Patient Spontanous Breathing and Patient connected to nasal cannula oxygen  Post-op Assessment: Report given to RN and Patient moving all extremities X 4  Post vital signs: Reviewed and stable  Last Vitals:  Vitals Value Taken Time  BP 112/65 05/11/22 1015  Temp    Pulse 86 05/11/22 1017  Resp 23 05/11/22 1017  SpO2 95 % 05/11/22 1017  Vitals shown include unvalidated device data.  Last Pain:  Vitals:   05/11/22 0634  TempSrc: Oral  PainSc: 0-No pain         Complications: No notable events documented.

## 2022-05-11 NOTE — Interval H&P Note (Signed)
History and Physical Interval Note:  72 y.o. female with multiple renal calculi and bilateral obstruction.  Underwent left ureteroscopy/laser lithotripsy 04/16/2022 and presents for second right ureteroscopy/stone removal and second look left ureteroscopy.  All questions were answered and she desires to proceed.  Preop urine culture without significant growth.  CV: RRR Lungs: Clear  05/11/2022 7:19 AM  Julie Stout  has presented today for surgery, with the diagnosis of BIlateral Nephrolithiasis.  The various methods of treatment have been discussed with the patient and family. After consideration of risks, benefits and other options for treatment, the patient has consented to  Procedure(s): CYSTOSCOPY/URETEROSCOPY/HOLMIUM LASER/STENT EXCHANGE (Bilateral) as a surgical intervention.  The patient's history has been reviewed, patient examined, no change in status, stable for surgery.  I have reviewed the patient's chart and labs.  Questions were answered to the patient's satisfaction.     [place]

## 2022-05-11 NOTE — Anesthesia Preprocedure Evaluation (Signed)
Anesthesia Evaluation  Patient identified by MRN, date of birth, ID band Patient awake    Reviewed: Allergy & Precautions, NPO status , Patient's Chart, lab work & pertinent test results  History of Anesthesia Complications Negative for: history of anesthetic complications  Airway Mallampati: III  TM Distance: <3 FB Neck ROM: full    Dental  (+) Chipped, Poor Dentition, Missing   Pulmonary neg shortness of breath, sleep apnea    Pulmonary exam normal        Cardiovascular Exercise Tolerance: Good hypertension, (-) angina (-) Past MI Normal cardiovascular exam     Neuro/Psych negative neurological ROS  negative psych ROS   GI/Hepatic negative GI ROS, Neg liver ROS,neg GERD  ,,  Endo/Other  negative endocrine ROS    Renal/GU      Musculoskeletal   Abdominal   Peds  Hematology negative hematology ROS (+)   Anesthesia Other Findings Past Medical History: No date: Hypertension No date: Obesity No date: Osteopenia No date: Pes anserinus bursitis of right knee  Past Surgical History: No date: ABDOMINAL HYSTERECTOMY No date: CHOLECYSTECTOMY 08/19/2014: COLONOSCOPY WITH PROPOFOL; N/A     Comment:  Procedure: COLONOSCOPY WITH PROPOFOL;  Surgeon: Manya Silvas, MD;  Location: [facility] ENDOSCOPY;  Service:               Endoscopy;  Laterality: N/A; 02/01/2018: COLONOSCOPY WITH PROPOFOL; N/A     Comment:  Procedure: COLONOSCOPY WITH PROPOFOL;  Surgeon: Manya Silvas, MD;  Location: [facility] ENDOSCOPY;  Service:               Endoscopy;  Laterality: N/A; 04/16/2022: CYSTOSCOPY WITH STENT PLACEMENT; Right     Comment:  Procedure: CYSTOSCOPY WITH STENT PLACEMENT;  Surgeon:               Abbie Sons, MD;  Location: [facility] ORS;  Service:               Urology;  Laterality: Right; 04/16/2022: CYSTOSCOPY/URETEROSCOPY/HOLMIUM LASER/STENT PLACEMENT; Left     Comment:  Procedure:  CYSTOSCOPY/URETEROSCOPY/HOLMIUM LASER/STENT               PLACEMENT;  Surgeon: Abbie Sons, MD;  Location:               [facility] ORS;  Service: Urology;  Laterality: Left;  BMI    Body Mass Index: 34.75 kg/m      Reproductive/Obstetrics negative OB ROS                             Anesthesia Physical Anesthesia Plan  ASA: 3  Anesthesia Plan: General ETT   Post-op Pain Management:    Induction: Intravenous  PONV Risk Score and Plan: Ondansetron, Dexamethasone, Midazolam and Treatment may vary due to age or medical condition  Airway Management Planned: Oral ETT and Video Laryngoscope Planned  Additional Equipment:   Intra-op Plan:   Post-operative Plan: Extubation in OR  Informed Consent: I have reviewed the patients History and Physical, chart, labs and discussed the procedure including the risks, benefits and alternatives for the proposed anesthesia with the patient or authorized representative who has indicated his/her understanding and acceptance.     Dental Advisory Given  Plan Discussed with: Anesthesiologist, CRNA and Surgeon  Anesthesia Plan Comments: (Patient consented for risks  of anesthesia including but not limited to:  - adverse reactions to medications - damage to eyes, teeth, lips or other oral mucosa - nerve damage due to positioning  - sore throat or hoarseness - Damage to heart, brain, nerves, lungs, other parts of body or loss of life  Patient voiced understanding.)       Anesthesia Quick Evaluation

## 2022-05-11 NOTE — Op Note (Signed)
Preoperative diagnosis: Bilateral nephrolithiasis   Postoperative diagnosis: Bilateral nephrolithiasis  Procedure:  Cystoscopy Bilateral ureteroscopy and stone removal Ureteroscopic laser lithotripsy Bilateral ureteral stent exchange (54F/26-right; 54F/24-left)  Bilateral retrograde pyelography with interpretation Intraoperative fluoroscopy  Surgeon: Nicki Reaper C. Tatayana Beshears, M.D.  Anesthesia: General  Complications: None  Intraoperative findings:  Right ureteropyeloscopy-right ureter normal in appearance without mucosal lesions or stricture.  Impacted UPJ calculus measuring 12 mm.  Severe calyceal dilation with lower calyceal calculi x 4 measuring 12 mm, 10 mm, 5 mm and 5 mm Left ureteropyeloscopy-left ureter with several small fragments.  Additional fragments noted left renal pelvis, lower calyx and upper calyx Right retrograde pyelogram post procedure showed no contrast extravasation.  Severe hydronephrosis noted Left retrograde pyelogram post procedure showed no extravasation or filling defects  EBL: Minimal  Specimens: Calculus fragments for analysis   Indication: Celsey L Rilling is a 72 y.o. female with bilateral nonobstructing renal pelvic calculi and bilateral nephrolithiasis.  Underwent bilateral ureteral stent placement and left ureteroscopy 04/16/2022.  She presents for stage ureteroscopy with second look left ureteroscopy and treatment of her right renal calculi.  After reviewing the management options for treatment, the patient elected to proceed with the above surgical procedure(s). We have discussed the potential benefits and risks of the procedure, side effects of the proposed treatment, the likelihood of the patient achieving the goals of the procedure, and any potential problems that might occur during the procedure or recuperation. Informed consent has been obtained.  Description of procedure:  The patient was taken to the operating room and general anesthesia was  induced.  The patient was placed in the dorsal lithotomy position, prepped and draped in the usual sterile fashion, and preoperative antibiotics were administered. A preoperative time-out was performed.   A 21 French cystoscope was lubricated and passed per urethra.  Mild inflammatory changes noted secondary to indwelling stents.    The right ureteral stent was grasped with endoscopic forceps and brought out through the urethral meatus.  A 0.038 Sensor wire was placed through the stent and advanced into the renal pelvis under fluoroscopic guidance.  The cystoscope was removed and a dual-lumen catheter was placed over the Sensor wire.  Retrograde pyelogram was performed which showed filling defect at the UPJ consistent with patient's known stone and severe calyceal dilation/hydronephrosis.  A second Sensor wire was placed in a similar fashion.  A 12/14 French ureteral access sheath was placed over the working wire under fluoroscopic guidance without difficulty.  A single channel digital flexible ureteroscope was placed through the access sheath and advanced to the gate where the 12 mm obstructing UPJ calculus was identified.    A 200 m Moses holmium laser fiber was placed through the ureteroscope and the stone was dusted at a setting of 0.3J/120 Hz.  Once completely treated the ureteroscope was advanced into the renal pelvis.  Pyeloscopy was performed with findings as described above.  The noted above stones were dusted at a setting of 0.3J/120 Hz and additional noncontact laser lithotripsy at 0.6J/40 Hz  A fragment was placed in a 1.9 [place] nitinol basket and removed and sent for analysis.  Retrograde pyelogram was then performed with findings as described above.  All calyces were examined.  Due to suboptimal visualization it was difficult to assess if there were any significantly sized fragments remained.    The ureteral access sheath and ureteroscope were removed in tandem and the ureter showed  no evidence of injury or perforation.  A 54F/26 cm contour  ureteral stent was placed under fluoroscopic guidance.  The wire was then removed with an adequate stent curl noted in the renal pelvis as well as in the bladder.  The cystoscope was repassed and the left ureteral stent was grasped with endoscopic forceps and brought out through the urethral meatus.  The Sensor wire was placed and stent advanced into the renal pelvis.  The digital flexible ureteroscope was passed per urethra and the scope was easily advanced into the ureter alongside the guidewire.  Several small fragments were swept from the ureter with a 1.9 French 0 tip nitinol basket.  Once the ureter was cleared the scope was advanced into the renal pelvis and pyeloscopy was performed with findings as described above.  3 fragments in the renal pelvis estimated at 3 mm were removed with the basket.  Additional lower calyceal calculi were dusted with the holmium laser at 0.3J/80 Hz.  Retrograde pyelogram was performed with findings as described above.  All calyces were examined under fluoroscopy and no significant sized fragments were identified.  A 19F/24 cm Contour ureteral stent with tether was placed under fluoroscopic guidance with good positioning noted proximally and distally.  The bladder was then emptied and the procedure ended.  The patient appeared to tolerate the procedure well and without complications.  After anesthetic reversal the patient was transported to the PACU in stable condition.   Plan: She will remove her left ureteral stent via the tether on Thursday, 05/13/2022 Will leave her right ureteral stent indwelling x 4 weeks as she is at high risk of developing ureteral stricture.  Will schedule a renal scan for differential function as she does have significant right renal atrophy.   John Giovanni, MD

## 2022-05-11 NOTE — Discharge Instructions (Addendum)
AMBULATORY SURGERY  DISCHARGE INSTRUCTIONS   The drugs that you were given will stay in your system until tomorrow so for the next 24 hours you should not:  Drive an automobile Make any legal decisions Drink any alcoholic beverage   You may resume regular meals tomorrow.  Today it is better to start with liquids and gradually work up to solid foods.  You may eat anything you prefer, but it is better to start with liquids, then soup and crackers, and gradually work up to solid foods.   Please notify your doctor immediately if you have any unusual bleeding, trouble breathing, redness and pain at the surgery site, drainage, fever, or pain not relieved by medication.    Additional Instructions:  DISCHARGE INSTRUCTIONS FOR KIDNEY STONE/URETERAL STENT   MEDICATIONS:  1. Resume all your other meds from home.  2.  AZO (over-the-counter) can help with the burning/stinging when you urinate. 3. Rxs for silodosin and Solifenacin were sent to your pharmacy.  These will help with stent/bladder irritation.  The most common side effects of Solifenacin are dry mouth and constipation.  ACTIVITY:  1. May resume regular activities in 24 hours. 2. No driving while on narcotic pain medications  3. Drink plenty of water  4. Continue to walk at home - you can still get blood clots when you are at home, so keep active, but don't over do it.  5. May return to work/school tomorrow or when you feel ready   BATHING:  1. You can shower. 2. You have a string coming from your urethra: The stent string is attached to your ureteral stent. Do not pull on this.   SIGNS/SYMPTOMS TO CALL:  Common postoperative symptoms include urinary frequency, urgency, bladder spasm and blood in the urine  Please call us if you have a fever greater than 101.5, uncontrolled nausea/vomiting, uncontrolled pain, dizziness, unable to urinate, excessively bloody urine, chest pain, shortness of breath, leg swelling, leg pain, or any  other concerns or questions.   You can reach [place] at [phone].   FOLLOW-UP:  1.  Your right obstructing calculus was impacted and recommend leaving the right-sided stent in for 1 month as you are at risk for developing scar tissue 2. You have a string attached to your left stent, you may remove it on Thursday morning 05/13/2022. To do this, pull the string until the stent is completely removed. You may feel an odd sensation in your back.       Please contact your physician with any problems or Same Day Surgery at [phone], Monday through Friday 6 am to 4 pm, or [place] [facility] number at [phone].

## 2022-05-11 NOTE — Anesthesia Procedure Notes (Signed)
Procedure Name: Intubation Date/Time: 05/11/2022 7:35 AM  Performed by: Hilbert Odor, CRNAPre-anesthesia Checklist: Patient identified, Patient being monitored, Timeout performed, Emergency Drugs available and Suction available Patient Re-evaluated:Patient Re-evaluated prior to induction Oxygen Delivery Method: Circle system utilized Preoxygenation: Pre-oxygenation with 100% oxygen Induction Type: IV induction Ventilation: Mask ventilation without difficulty Laryngoscope Size: 3 and McGraph Grade View: Grade I Tube type: Oral Tube size: 7.0 mm Number of attempts: 1 Airway Equipment and Method: Stylet Placement Confirmation: ETT inserted through vocal cords under direct vision, positive ETCO2 and breath sounds checked- equal and bilateral Secured at: 21 cm Tube secured with: Tape Dental Injury: Teeth and Oropharynx as per pre-operative assessment

## 2022-05-11 NOTE — Anesthesia Postprocedure Evaluation (Signed)
Anesthesia Post Note  Patient: Julie Stout  Procedure(s) Performed: CYSTOSCOPY/URETEROSCOPY/HOLMIUM LASER/STENT EXCHANGE (Bilateral)  Patient location during evaluation: PACU Anesthesia Type: General Level of consciousness: awake and alert Pain management: pain level controlled Vital Signs Assessment: post-procedure vital signs reviewed and stable Respiratory status: spontaneous breathing, nonlabored ventilation, respiratory function stable and patient connected to nasal cannula oxygen Cardiovascular status: blood pressure returned to baseline and stable Postop Assessment: no apparent nausea or vomiting Anesthetic complications: no   No notable events documented.   Last Vitals:  Vitals:   05/11/22 1045 05/11/22 1105  BP: 118/70 129/77  Pulse: 87 87  Resp: (!) 22 20  Temp: (!) 36.1 C (!) 36.1 C  SpO2: 96% 95%    Last Pain:  Vitals:   05/11/22 1105  TempSrc: Temporal  PainSc: 0-No pain                 Arita Miss

## 2022-05-12 ENCOUNTER — Encounter: Payer: Self-pay | Admitting: Urology

## 2022-05-13 ENCOUNTER — Other Ambulatory Visit: Payer: Self-pay

## 2022-05-13 ENCOUNTER — Ambulatory Visit (INDEPENDENT_AMBULATORY_CARE_PROVIDER_SITE_OTHER): Payer: PPO | Admitting: Physician Assistant

## 2022-05-13 DIAGNOSIS — N200 Calculus of kidney: Secondary | ICD-10-CM

## 2022-05-13 DIAGNOSIS — N201 Calculus of ureter: Secondary | ICD-10-CM

## 2022-05-13 NOTE — Progress Notes (Signed)
Patient presents in clinic for removal of ureteral stent on a tether. Patient placed in dorsal recumbent position. Tether easily visualized, stent pulled in one smooth motion. No complications noted. Pt tolerated well. Pt to RTC as scheduled.

## 2022-05-18 LAB — CALCULI, WITH PHOTOGRAPH (CLINICAL LAB)
Calcium Oxalate Dihydrate: 10 %
Calcium Oxalate Monohydrate: 90 %
Weight Calculi: 6 mg

## 2022-05-31 ENCOUNTER — Ambulatory Visit
Admission: RE | Admit: 2022-05-31 | Discharge: 2022-05-31 | Disposition: A | Discharge status: Home / Self Care | Payer: PPO | Source: Ambulatory Visit | Attending: Urology | Admitting: Urology

## 2022-05-31 DIAGNOSIS — N200 Calculus of kidney: Secondary | ICD-10-CM | POA: Insufficient documentation

## 2022-05-31 DIAGNOSIS — N1330 Unspecified hydronephrosis: Secondary | ICD-10-CM | POA: Diagnosis not present

## 2022-06-09 ENCOUNTER — Encounter: Payer: Self-pay | Admitting: Urology

## 2022-06-09 ENCOUNTER — Ambulatory Visit: Payer: PPO | Admitting: Urology

## 2022-06-09 ENCOUNTER — Ambulatory Visit
Admission: RE | Admit: 2022-06-09 | Discharge: 2022-06-09 | Disposition: A | Discharge status: Home / Self Care | Payer: PPO | Source: Ambulatory Visit | Attending: Urology | Admitting: Urology

## 2022-06-09 VITALS — BP 174/117 | HR 83 | Ht 62.0 in | Wt 190.0 lb

## 2022-06-09 DIAGNOSIS — N200 Calculus of kidney: Secondary | ICD-10-CM | POA: Insufficient documentation

## 2022-06-09 DIAGNOSIS — N1330 Unspecified hydronephrosis: Secondary | ICD-10-CM

## 2022-06-09 LAB — URINALYSIS, COMPLETE
Bilirubin, UA: NEGATIVE
Glucose, UA: NEGATIVE
Ketones, UA: NEGATIVE
Nitrite, UA: NEGATIVE
Specific Gravity, UA: 1.025 (ref 1.005–1.030)
Urobilinogen, Ur: 0.2 mg/dL (ref 0.2–1.0)
pH, UA: 5.5 (ref 5.0–7.5)

## 2022-06-09 LAB — MICROSCOPIC EXAMINATION: RBC, Urine: >30 /hpf — AB (ref 0–2)

## 2022-06-09 MED ORDER — GEMTESA 75 MG PO TABS: 75.0000 mg | ORAL_TABLET | Freq: Every day | ORAL | 0 refills | Status: AC

## 2022-06-09 NOTE — Progress Notes (Signed)
I, DeAsia L Maxie,acting as a scribe for Riki Altes, MD.,have documented all relevant documentation on the behalf of Riki Altes, MD,as directed by  Riki Altes, MD while in the presence of Riki Altes, MD.   06/09/22 11:03 AM   Cindra L Matulich [birth year] [id number]  Referring provider: Shon Hale, MD [address] [place],  [place] [address]  Chief Complaint  Patient presents with   Cysto Stent Removal    HPI: Julie Stout is a 72 y.o. female presents for follow-up visit.  History of bilateral renal calculi who underwent staged ureteroscopy. Last procedure 05/11/22 with clearing of left ureteral fragments and treatment of her right-sided calculi.  She had an impacted UPJ calculus with severe right hydronephrosis and calyceal dilation. She had a follow-up renal ultrasound on 05-31-22 which showed severe right hydronephrosis and nephrolithiasis. There was resolution of her left hydronephrosis. Having significant stent irritative symptoms.    PMH: Past Medical History:  Diagnosis Date   Hypertension    Obesity    Osteopenia    Pes anserinus bursitis of right knee     Surgical History: Past Surgical History:  Procedure Laterality Date   ABDOMINAL HYSTERECTOMY     CHOLECYSTECTOMY     COLONOSCOPY WITH PROPOFOL N/A 08/19/2014   Procedure: COLONOSCOPY WITH PROPOFOL;  Surgeon: Scot Jun, MD;  Location: [facility] ENDOSCOPY;  Service: Endoscopy;  Laterality: N/A;   COLONOSCOPY WITH PROPOFOL N/A 02/01/2018   Procedure: COLONOSCOPY WITH PROPOFOL;  Surgeon: Scot Jun, MD;  Location: [facility] ENDOSCOPY;  Service: Endoscopy;  Laterality: N/A;   CYSTOSCOPY WITH STENT PLACEMENT Right 04/16/2022   Procedure: CYSTOSCOPY WITH STENT PLACEMENT;  Surgeon: Riki Altes, MD;  Location: [facility] ORS;  Service: Urology;  Laterality: Right;   CYSTOSCOPY/URETEROSCOPY/HOLMIUM LASER/STENT PLACEMENT Left 04/16/2022   Procedure:  CYSTOSCOPY/URETEROSCOPY/HOLMIUM LASER/STENT PLACEMENT;  Surgeon: Riki Altes, MD;  Location: [facility] ORS;  Service: Urology;  Laterality: Left;   CYSTOSCOPY/URETEROSCOPY/HOLMIUM LASER/STENT PLACEMENT Bilateral 05/11/2022   Procedure: CYSTOSCOPY/URETEROSCOPY/HOLMIUM LASER/STENT EXCHANGE;  Surgeon: Riki Altes, MD;  Location: [facility] ORS;  Service: Urology;  Laterality: Bilateral;    Home Medications:  Allergies as of 06/09/2022   No Known Allergies      Medication List        Accurate as of June 09, 2022 11:03 AM. If you have any questions, ask your nurse or doctor.          cholecalciferol 10 MCG (400 UNIT) Tabs tablet Commonly known as: VITAMIN D3 Take 400 Units by mouth.   Gemtesa 75 MG Tabs Generic drug: Vibegron Take 1 tablet (75 mg total) by mouth daily. Started by: Riki Altes, MD   hydrochlorothiazide 12.5 MG capsule Commonly known as: MICROZIDE Take 12.5 mg by mouth daily.   ibuprofen 800 MG tablet Commonly known as: ADVIL Take 800 mg by mouth every 8 (eight) hours as needed.   lisinopril 20 MG tablet Commonly known as: ZESTRIL Take 20 mg by mouth every morning.   ondansetron 4 MG disintegrating tablet Commonly known as: ZOFRAN-ODT Take 1 tablet (4 mg total) by mouth every 8 (eight) hours as needed for nausea or vomiting.        Allergies: No Known Allergies   Social History:  reports that she has never smoked. She has never used smokeless tobacco. She reports that she does not drink alcohol and does not use drugs.   Physical Exam: BP (!) 174/117   Pulse 83   Ht 5'  2" (1.575 m)   Wt 190 lb (86.2 kg)   BMI 34.75 kg/m   Constitutional:  Alert, No acute distress. HEENT: [place] AT Respiratory: Normal respiratory effort, no increased work of breathing. GI: Abdomen is soft, nontender, nondistended, no abdominal masses Skin: No rashes, bruises or suspicious lesions. Neurologic: Grossly intact, no focal deficits, moving all 4  extremities. Psychiatric: Normal mood and affect.   Pertinent Imaging: Renal US personally reviewed and interpreted    Ultrasound renal complete  Narrative CLINICAL DATA:  Bilateral Hydronephrosis  EXAM: RENAL / URINARY TRACT ULTRASOUND COMPLETE  COMPARISON:  April 13, 2022  FINDINGS: Right Kidney:  Renal measurements: 12.8 x 6.7 x 6.8 cm = volume: 302 mL. Echogenicity within normal limits. Severe RIGHT hydronephrosis, similar in comparison to prior CT. There is diffuse cortical thinning. There are several echogenic foci with twinkle artifact noted measuring up to 13 mm in the interpolar and lower kidney. Partial visualization of a nephroureteral stent.  Left Kidney:  Renal measurements: 10.9 x 6.5 x 5.1 cm = volume: 188 mL. Echogenicity within normal limits. No suspicious mass or hydronephrosis visualized. Benign cyst is noted which measures to 3 cm (for which no dedicated imaging follow-up is recommended).  Bladder:  Nearly completely decompressed, limiting evaluation.  Other:  Increased hepatic echogenicity.  IMPRESSION: 1. There is persistent severe RIGHT hydronephrosis. Multiple nephrolithiasis are noted. 2. Resolution of LEFT-sided hydronephrosis. 3. Hepatic steatosis.   Electronically Signed By: Meda Klinefelter M.D. On: 05/31/2022 16:08    Assessment & Plan:    Status post bilateral ureteroscopy Her left hydronephrosis has resolved Severe persistent right hydronephrosis, most likely secondary to chronic obstruction. Ultrasound interpreted as multiple renal calculi, however these may be small passable fragments At this time as I would like a KUB to access for possible significant sized stone fragments and a Lasix renal scan while stented to determine the differential function of the right kidney.  Her large right UPJ stone was impacted and we discussed that she is at high risk for  development of a ureteral stricture. If the renal scan does  not show significant function, no treatment would be needed if she developed an asymptomatic ureteral stricture.  Stent removal not recommended until renal scan performed and KUB reviewed  I have reviewed the above documentation for accuracy and completeness, and I agree with the above.   Riki Altes, MD  [facility] [address] [place], [place] [address] [phone]

## 2022-06-09 NOTE — Progress Notes (Signed)
Julie Stout presents for an office/procedure visit. BP today is high She is complaint/noncompliant with BP medication. Greater than 140/90. Provider  notified. Pt advised to   see PCP. Pt voiced understanding.

## 2022-06-14 ENCOUNTER — Telehealth: Payer: Self-pay | Admitting: *Deleted

## 2022-06-14 NOTE — Telephone Encounter (Signed)
KUB reviewed and only punctate calcifications and no significant sized stone fragments noted.  No further treatment of her residual fragments needed.  Will schedule stent removal after her renal scan which is scheduled 4/24.   Notified patient as instructed, patient pleased. Discussed follow-up appointments, patient agrees

## 2022-06-16 ENCOUNTER — Encounter
Admission: RE | Admit: 2022-06-16 | Discharge: 2022-06-16 | Disposition: A | Discharge status: Home / Self Care | Payer: PPO | Source: Ambulatory Visit | Attending: Urology | Admitting: Urology

## 2022-06-16 DIAGNOSIS — N1330 Unspecified hydronephrosis: Secondary | ICD-10-CM | POA: Insufficient documentation

## 2022-06-16 DIAGNOSIS — N201 Calculus of ureter: Secondary | ICD-10-CM | POA: Diagnosis not present

## 2022-06-16 MED ORDER — FUROSEMIDE 10 MG/ML IJ SOLN
43.1000 mg | Freq: Once | INTRAMUSCULAR | Status: AC
  Administered 2022-06-16: 43.1 mg via INTRAVENOUS
  Filled 2022-06-16: qty 4.3

## 2022-06-16 MED ORDER — TECHNETIUM TC 99M MERTIATIDE
5.2100 | Freq: Once | INTRAVENOUS | Status: AC | PRN
  Administered 2022-06-16: 5.21 via INTRAVENOUS

## 2022-06-18 ENCOUNTER — Encounter: Payer: Self-pay | Admitting: Urology

## 2022-06-18 ENCOUNTER — Ambulatory Visit: Payer: PPO | Admitting: Urology

## 2022-06-18 VITALS — BP 140/80 | HR 74 | Ht 61.0 in | Wt 190.0 lb

## 2022-06-18 DIAGNOSIS — N200 Calculus of kidney: Secondary | ICD-10-CM

## 2022-06-18 DIAGNOSIS — Z466 Encounter for fitting and adjustment of urinary device: Secondary | ICD-10-CM

## 2022-06-18 LAB — URINALYSIS, COMPLETE
Bilirubin, UA: NEGATIVE
Glucose, UA: NEGATIVE
Ketones, UA: NEGATIVE
Nitrite, UA: NEGATIVE
Specific Gravity, UA: 1.015 (ref 1.005–1.030)
Urobilinogen, Ur: 0.2 mg/dL (ref 0.2–1.0)
pH, UA: 6 (ref 5.0–7.5)

## 2022-06-18 LAB — MICROSCOPIC EXAMINATION: Epithelial Cells (non renal): >10 /hpf — AB (ref 0–10)

## 2022-06-18 MED ORDER — SULFAMETHOXAZOLE-TRIMETHOPRIM 800-160 MG PO TABS
1.0000 | ORAL_TABLET | Freq: Once | ORAL | Status: AC
  Administered 2022-06-18: 1 via ORAL

## 2022-06-21 NOTE — Progress Notes (Signed)
Indications: Patient is 72 y.o., who is s/p staged ureteroscopy for bilateral renal calculi.  The patient is presenting today for right stent removal.  Refer to my prior note 06/09/2022.  KUB showed only minute stone fragments.  Renal scan was felt to show 57 % function of the right kidney however kidney obstruction of the right kidney however the right kidney was stented at the time of the procedure and she has right renal atrophy.  Procedure:  Flexible Cystoscopy with stent removal ([address])  Timeout was performed and the correct patient, procedure and participants were identified.    Description:  The patient was prepped and draped in the usual sterile fashion. Flexible cystosopy was performed.  The stent was visualized, grasped, and removed intact without difficulty. The patient tolerated the procedure well.  A single dose of oral antibiotics was given.  Complications:  None  Plan:  Instructed to call for fever/flank pain post stent removal Renal scan findings are not consistent with prior imaging and she was recently stented and would not represent obstruction.  I believe the poor washout was secondary to decreased renal function and marked hydronephrosis 55-month follow-up with KUB   Irineo Axon, MD

## 2022-06-25 ENCOUNTER — Encounter: Payer: PPO | Admitting: Urology

## 2022-07-05 DIAGNOSIS — N1831 Chronic kidney disease, stage 3a: Secondary | ICD-10-CM | POA: Diagnosis not present

## 2022-07-05 DIAGNOSIS — I10 Essential (primary) hypertension: Secondary | ICD-10-CM | POA: Diagnosis not present

## 2022-07-05 DIAGNOSIS — E1165 Type 2 diabetes mellitus with hyperglycemia: Secondary | ICD-10-CM | POA: Diagnosis not present

## 2022-07-05 DIAGNOSIS — I700 Atherosclerosis of aorta: Secondary | ICD-10-CM | POA: Diagnosis not present

## 2022-07-05 DIAGNOSIS — R7309 Other abnormal glucose: Secondary | ICD-10-CM | POA: Diagnosis not present

## 2022-07-05 DIAGNOSIS — E559 Vitamin D deficiency, unspecified: Secondary | ICD-10-CM | POA: Diagnosis not present

## 2022-07-05 DIAGNOSIS — E7800 Pure hypercholesterolemia, unspecified: Secondary | ICD-10-CM | POA: Diagnosis not present

## 2022-09-16 ENCOUNTER — Other Ambulatory Visit: Payer: Self-pay | Admitting: *Deleted

## 2022-09-16 DIAGNOSIS — N200 Calculus of kidney: Secondary | ICD-10-CM

## 2022-09-17 ENCOUNTER — Ambulatory Visit: Payer: PPO | Admitting: Urology

## 2022-09-17 ENCOUNTER — Encounter: Payer: Self-pay | Admitting: Urology

## 2022-09-17 ENCOUNTER — Ambulatory Visit
Admission: RE | Admit: 2022-09-17 | Discharge: 2022-09-17 | Disposition: A | Discharge status: Home / Self Care | Payer: PPO | Source: Ambulatory Visit | Attending: Urology | Admitting: Urology

## 2022-09-17 ENCOUNTER — Ambulatory Visit
Admission: RE | Admit: 2022-09-17 | Discharge: 2022-09-17 | Disposition: A | Discharge status: Home / Self Care | Payer: PPO | Attending: Urology | Admitting: Urology

## 2022-09-17 VITALS — BP 167/106 | HR 87 | Ht 61.0 in | Wt 190.0 lb

## 2022-09-17 DIAGNOSIS — N1330 Unspecified hydronephrosis: Secondary | ICD-10-CM

## 2022-09-17 DIAGNOSIS — N200 Calculus of kidney: Secondary | ICD-10-CM | POA: Insufficient documentation

## 2022-09-17 DIAGNOSIS — I878 Other specified disorders of veins: Secondary | ICD-10-CM | POA: Diagnosis not present

## 2022-09-17 NOTE — Progress Notes (Signed)
I, Maysun Anabel Bene, acting as a scribe for Riki Altes, MD., have documented all relevant documentation on the behalf of Riki Altes, MD, as directed by Riki Altes, MD while in the presence of Riki Altes, MD.  09/17/2022 10:45 AM   Eather Colas L Rufus 1950-03-16 [id number]  Referring provider: Shon Hale, MD [address] [place],  [place] [address]  Chief Complaint  Patient presents with   Nephrolithiasis   Urologic history: 1. Status post bilateral ureteroscopy History of bilateral renal calculi who underwent staged ureteroscopy. Last procedure 05/11/22 with clearing of left ureteral fragments and treatment of her right-sided calculi.   HPI: Julie Stout is a 72 y.o. female presents for follow-up visit.   Status post-staged ureteroscopy for bilateral ureteral calculi with her last procedure, March 2024.  She had an impacted right UPJ calculus measuring 12 mm with severe hydronephrosis and renal atrophy. She had no right-sided symptoms indicating long-standing obstruction. Her stent was removed 06/18/22.  Since stent removal, she has been asymptomatic and denies flank-abdominal or pelvic pain.  No bothersome lower urinary tract symptoms.  A renal scan performed prior to stent removal showed 47% function of the right kidney, which based on the appearance of her cortex on CT and ultrasound was surprising. It also was interpreted as obstruction. However, she was stented at the time the renal scan was done.   PMH: Past Medical History:  Diagnosis Date   Hypertension    Obesity    Osteopenia    Pes anserinus bursitis of right knee     Surgical History: Past Surgical History:  Procedure Laterality Date   ABDOMINAL HYSTERECTOMY     CHOLECYSTECTOMY     COLONOSCOPY WITH PROPOFOL N/A 08/19/2014   Procedure: COLONOSCOPY WITH PROPOFOL;  Surgeon: Scot Jun, MD;  Location: [facility] ENDOSCOPY;  Service: Endoscopy;  Laterality: N/A;    COLONOSCOPY WITH PROPOFOL N/A 02/01/2018   Procedure: COLONOSCOPY WITH PROPOFOL;  Surgeon: Scot Jun, MD;  Location: [facility] ENDOSCOPY;  Service: Endoscopy;  Laterality: N/A;   CYSTOSCOPY WITH STENT PLACEMENT Right 04/16/2022   Procedure: CYSTOSCOPY WITH STENT PLACEMENT;  Surgeon: Riki Altes, MD;  Location: [facility] ORS;  Service: Urology;  Laterality: Right;   CYSTOSCOPY/URETEROSCOPY/HOLMIUM LASER/STENT PLACEMENT Left 04/16/2022   Procedure: CYSTOSCOPY/URETEROSCOPY/HOLMIUM LASER/STENT PLACEMENT;  Surgeon: Riki Altes, MD;  Location: [facility] ORS;  Service: Urology;  Laterality: Left;   CYSTOSCOPY/URETEROSCOPY/HOLMIUM LASER/STENT PLACEMENT Bilateral 05/11/2022   Procedure: CYSTOSCOPY/URETEROSCOPY/HOLMIUM LASER/STENT EXCHANGE;  Surgeon: Riki Altes, MD;  Location: [facility] ORS;  Service: Urology;  Laterality: Bilateral;    Home Medications:  Allergies as of 09/17/2022   No Known Allergies      Medication List        Accurate as of September 17, 2022 10:45 AM. If you have any questions, ask your nurse or doctor.          cholecalciferol 10 MCG (400 UNIT) Tabs tablet Commonly known as: VITAMIN D3 Take 400 Units by mouth.   Gemtesa 75 MG Tabs Generic drug: Vibegron Take 1 tablet (75 mg total) by mouth daily.   hydrochlorothiazide 12.5 MG capsule Commonly known as: MICROZIDE Take 12.5 mg by mouth daily.   ibuprofen 800 MG tablet Commonly known as: ADVIL Take 800 mg by mouth every 8 (eight) hours as needed.   lisinopril 20 MG tablet Commonly known as: ZESTRIL Take 20 mg by mouth every morning.   ondansetron 4 MG disintegrating tablet Commonly known as: ZOFRAN-ODT Take  1 tablet (4 mg total) by mouth every 8 (eight) hours as needed for nausea or vomiting.        Allergies: No Known Allergies   Social History:  reports that she has never smoked. She has never used smokeless tobacco. She reports that she does not drink alcohol and does not use drugs.   Physical  Exam: BP (!) 167/106   Pulse 87   Ht 5\' 1"  (1.549 m)   Wt 190 lb (86.2 kg)   BMI 35.90 kg/m   Constitutional:  Alert and oriented, No acute distress. HEENT: [place] AT Respiratory: Normal respiratory effort, no increased work of breathing. Psychiatric: Normal mood and affect.   Pertinent Imaging: KUB performed today was personally reviewed and interpreted. There is a calcification in the expected location of the proximal ureter measuring approximately 7 mm. There are faint calcifications overlying right renal outline and minute calcifications overlying the inferior portion of the left renal outline.   Assessment & Plan:    1. Bilateral nephrolithiasis Calcification in the vicinity of the right proximal ureter and we discussed the possibility of a stone with ureteral stricture, as her previous stone was severely impacted. Discussed obtaining a CT scan versus cystoscopy and anesthesia with retrograde pyrogram and possible ureteroscopy.  She does not desire to pursue evaluation at this time since she is asymptomatic. We did discuss the possibility if stricture was present that she would have diminishing function of her kidney on the right. She will call if she develops flank or abdominal pain Recommended a 3 month follow-up with a renal ultrasound.  Check serum creatinine  I have reviewed the above documentation for accuracy and completeness, and I agree with the above.   Riki Altes, MD  [facility] [address] [place], [place] [address] [phone]

## 2022-09-19 ENCOUNTER — Encounter: Payer: Self-pay | Admitting: Urology

## 2022-09-21 ENCOUNTER — Other Ambulatory Visit: Payer: PPO

## 2022-09-21 DIAGNOSIS — N200 Calculus of kidney: Secondary | ICD-10-CM

## 2022-09-21 DIAGNOSIS — N1330 Unspecified hydronephrosis: Secondary | ICD-10-CM

## 2022-10-05 DIAGNOSIS — E785 Hyperlipidemia, unspecified: Secondary | ICD-10-CM | POA: Diagnosis not present

## 2022-11-08 ENCOUNTER — Encounter: Payer: Self-pay | Admitting: Urology
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
## 2022-12-16 ENCOUNTER — Ambulatory Visit
Admission: RE | Admit: 2022-12-16 | Discharge: 2022-12-16 | Disposition: A | Discharge status: Home / Self Care | Payer: PPO | Source: Ambulatory Visit | Attending: Urology | Admitting: Urology

## 2022-12-16 DIAGNOSIS — R93429 Abnormal radiologic findings on diagnostic imaging of unspecified kidney: Secondary | ICD-10-CM | POA: Diagnosis not present

## 2022-12-16 DIAGNOSIS — N132 Hydronephrosis with renal and ureteral calculous obstruction: Secondary | ICD-10-CM | POA: Diagnosis not present

## 2022-12-16 DIAGNOSIS — N200 Calculus of kidney: Secondary | ICD-10-CM | POA: Diagnosis not present

## 2022-12-16 DIAGNOSIS — N1330 Unspecified hydronephrosis: Secondary | ICD-10-CM | POA: Diagnosis not present

## 2022-12-17 ENCOUNTER — Telehealth: Payer: Self-pay | Admitting: Urology

## 2022-12-17 NOTE — Telephone Encounter (Signed)
Pt had u/s done 10/24 and they told her results would be 3-5 business days.  She has appt w/Stoioff on Monday and wanted to make sure he would have results by then.  I told her IF she didn't hear from [place] to keept the appt, if not, we would confirm on Monday and if results weren't there, we would call her to r/s.

## 2022-12-17 NOTE — Telephone Encounter (Signed)
Rescheduled her appt

## 2022-12-20 ENCOUNTER — Ambulatory Visit: Payer: PPO | Admitting: Urology

## 2023-01-05 DIAGNOSIS — E559 Vitamin D deficiency, unspecified: Secondary | ICD-10-CM | POA: Diagnosis not present

## 2023-01-05 DIAGNOSIS — E7800 Pure hypercholesterolemia, unspecified: Secondary | ICD-10-CM | POA: Diagnosis not present

## 2023-01-05 DIAGNOSIS — Z0000 Encounter for general adult medical examination without abnormal findings: Secondary | ICD-10-CM | POA: Diagnosis not present

## 2023-01-05 DIAGNOSIS — Z23 Encounter for immunization: Secondary | ICD-10-CM | POA: Diagnosis not present

## 2023-01-05 DIAGNOSIS — I10 Essential (primary) hypertension: Secondary | ICD-10-CM | POA: Diagnosis not present

## 2023-01-05 DIAGNOSIS — N183 Chronic kidney disease, stage 3 unspecified: Secondary | ICD-10-CM | POA: Diagnosis not present

## 2023-01-05 DIAGNOSIS — E1122 Type 2 diabetes mellitus with diabetic chronic kidney disease: Secondary | ICD-10-CM | POA: Diagnosis not present

## 2023-01-05 DIAGNOSIS — Z1231 Encounter for screening mammogram for malignant neoplasm of breast: Secondary | ICD-10-CM | POA: Diagnosis not present

## 2023-01-05 DIAGNOSIS — E1165 Type 2 diabetes mellitus with hyperglycemia: Secondary | ICD-10-CM | POA: Diagnosis not present

## 2023-01-05 DIAGNOSIS — M8580 Other specified disorders of bone density and structure, unspecified site: Secondary | ICD-10-CM | POA: Diagnosis not present

## 2023-01-06 ENCOUNTER — Encounter: Payer: Self-pay | Admitting: Urology

## 2023-01-06 ENCOUNTER — Ambulatory Visit: Payer: PPO | Admitting: Urology

## 2023-01-06 VITALS — BP 116/78 | HR 109 | Ht 61.0 in | Wt 190.0 lb

## 2023-01-06 DIAGNOSIS — N200 Calculus of kidney: Secondary | ICD-10-CM | POA: Diagnosis not present

## 2023-01-06 DIAGNOSIS — N1330 Unspecified hydronephrosis: Secondary | ICD-10-CM | POA: Diagnosis not present

## 2023-01-06 NOTE — Progress Notes (Signed)
I,Amy L Pierron,acting as a scribe for Julie Altes, MD.,have documented all relevant documentation on the behalf of Julie Altes, MD,as directed by  Julie Altes, MD while in the presence of Julie Altes, MD.  01/06/2023 10:21 AM   Julie Stout 1950-11-27 [id number]  Referring provider: Shon Hale, MD [address] [place],  [place] [address]  Chief Complaint  Patient presents with   Nephrolithiasis   Urologic history: 1.  Nephrolithiasis History of bilateral renal calculi who underwent staged ureteroscopy on 05/11/22 with clearing of left ureteral fragments and treatment of her right-sided calculi.  She had an impacted right UPJ calculus measuring 12 mm with severe hydronephrosis and renal atrophy. No right-sided symptoms indicating long-standing obstruction. Her stent was removed 06/18/22.   HPI: Julie Stout is a 72 y.o. female who presents today for a 3 month follow up.  KUB from 09/17/2022 showed a calcification in the vicinity of the right proximal ureter, and we discussed the possibility of ureteral stricture/ stone due to previous history of severe stone impaction. She was asymptomatic and declined. Follow-up retrograde pyelogram, possible ureteroscopy and stent placement, and the possibility of loss of renal function were discussed. Creatinine at that visit was 1.04.  Since her last visit, she remains asymptomatic, denies flank or abdominal pain. She had a visit with her PCP yesterday and creatinine will stable at 1.07. RUS performed 12/16/22 has not yet been read by radiology. On my review, there is moderate right hydronephrosis and renal calculi.   PMH: Past Medical History:  Diagnosis Date   Hypertension    Obesity    Osteopenia    Pes anserinus bursitis of right knee     Surgical History: Past Surgical History:  Procedure Laterality Date   ABDOMINAL HYSTERECTOMY     CHOLECYSTECTOMY     COLONOSCOPY WITH PROPOFOL N/A  08/19/2014   Procedure: COLONOSCOPY WITH PROPOFOL;  Surgeon: Scot Jun, MD;  Location: [facility] ENDOSCOPY;  Service: Endoscopy;  Laterality: N/A;   COLONOSCOPY WITH PROPOFOL N/A 02/01/2018   Procedure: COLONOSCOPY WITH PROPOFOL;  Surgeon: Scot Jun, MD;  Location: [facility] ENDOSCOPY;  Service: Endoscopy;  Laterality: N/A;   CYSTOSCOPY WITH STENT PLACEMENT Right 04/16/2022   Procedure: CYSTOSCOPY WITH STENT PLACEMENT;  Surgeon: Julie Altes, MD;  Location: [facility] ORS;  Service: Urology;  Laterality: Right;   CYSTOSCOPY/URETEROSCOPY/HOLMIUM LASER/STENT PLACEMENT Left 04/16/2022   Procedure: CYSTOSCOPY/URETEROSCOPY/HOLMIUM LASER/STENT PLACEMENT;  Surgeon: Julie Altes, MD;  Location: [facility] ORS;  Service: Urology;  Laterality: Left;   CYSTOSCOPY/URETEROSCOPY/HOLMIUM LASER/STENT PLACEMENT Bilateral 05/11/2022   Procedure: CYSTOSCOPY/URETEROSCOPY/HOLMIUM LASER/STENT EXCHANGE;  Surgeon: Julie Altes, MD;  Location: [facility] ORS;  Service: Urology;  Laterality: Bilateral;    Home Medications:  Allergies as of 01/06/2023   No Known Allergies      Medication List        Accurate as of January 06, 2023 10:21 AM. If you have any questions, ask your nurse or doctor.          cholecalciferol 10 MCG (400 UNIT) Tabs tablet Commonly known as: VITAMIN D3 Take 400 Units by mouth.   Gemtesa 75 MG Tabs Generic drug: Vibegron Take 1 tablet (75 mg total) by mouth daily.   hydrochlorothiazide 12.5 MG capsule Commonly known as: MICROZIDE Take 12.5 mg by mouth daily.   ibuprofen 800 MG tablet Commonly known as: ADVIL Take 800 mg by mouth every 8 (eight) hours as needed.   lisinopril 20 MG tablet Commonly known  as: ZESTRIL Take 20 mg by mouth every morning.   ondansetron 4 MG disintegrating tablet Commonly known as: ZOFRAN-ODT Take 1 tablet (4 mg total) by mouth every 8 (eight) hours as needed for nausea or vomiting.        Allergies: No Known Allergies  Family  History: History reviewed. No pertinent family history.  Social History:  reports that she has never smoked. She has never used smokeless tobacco. She reports that she does not drink alcohol and does not use drugs.   Physical Exam: BP 116/78   Pulse (!) 109   Ht 5\' 1"  (1.549 m)   Wt 190 lb (86.2 kg)   BMI 35.90 kg/m   Constitutional:  Alert and oriented, No acute distress. HEENT: [place] AT Respiratory: Normal respiratory effort, no increased work of breathing. Psychiatric: Normal mood and affect.  Pertinent Imaging: RUS performed 12/16/22. On my review, there is moderate right hydronephrosis and possible renal calculi. Awaiting final radiologic interpretation.    Assessment & Plan:    1. Right hydronephrosis.  She is at high risk for ureteral stricture secondary to severe stone infection.  I discussed her renal ultrasound findings of hydronephrosis and we discussed both obstructive and non-obstructive hydronephrosis.  Again discussed options of cystoscopy with right retrograde pyelogram and possible ureteroscopy stent placement; diuretic renogram to determine if hydronephrosis is obstructive.  She remains asymptomatic and again does not desire to pursue further evaluation at this time period.  We discussed if she has obstructive hydronephrosis she will eventually lose function in that kidney, which may or may not impact her overall renal function.  She desires to follow up in six months and will recheck creatinine at that time. Instructed to call earlier should she have flank or abdominal pain.  If she changes her mind and desires to pursue further evaluation she will call back.   I have reviewed the above documentation for accuracy and completeness, and I agree with the above.   Julie Altes, MD  [facility] [address], [address] [place], [place] [address] [phone]

## 2023-01-10 DIAGNOSIS — E119 Type 2 diabetes mellitus without complications: Secondary | ICD-10-CM | POA: Diagnosis not present

## 2023-01-10 DIAGNOSIS — H2513 Age-related nuclear cataract, bilateral: Secondary | ICD-10-CM | POA: Diagnosis not present

## 2023-01-10 DIAGNOSIS — H35363 Drusen (degenerative) of macula, bilateral: Secondary | ICD-10-CM | POA: Diagnosis not present

## 2023-01-10 DIAGNOSIS — H5213 Myopia, bilateral: Secondary | ICD-10-CM | POA: Diagnosis not present

## 2023-01-11 NOTE — Progress Notes (Signed)
Notified patient as instructed, patient pleased °

## 2023-05-02 DIAGNOSIS — E7800 Pure hypercholesterolemia, unspecified: Secondary | ICD-10-CM | POA: Diagnosis not present

## 2023-05-02 DIAGNOSIS — I10 Essential (primary) hypertension: Secondary | ICD-10-CM | POA: Diagnosis not present

## 2023-05-02 DIAGNOSIS — E1165 Type 2 diabetes mellitus with hyperglycemia: Secondary | ICD-10-CM | POA: Diagnosis not present

## 2023-05-12 DIAGNOSIS — E119 Type 2 diabetes mellitus without complications: Secondary | ICD-10-CM | POA: Diagnosis not present

## 2023-05-25 DIAGNOSIS — E119 Type 2 diabetes mellitus without complications: Secondary | ICD-10-CM | POA: Diagnosis not present

## 2023-07-06 ENCOUNTER — Ambulatory Visit: Payer: Self-pay | Admitting: Urology

## 2023-08-03 DIAGNOSIS — I10 Essential (primary) hypertension: Secondary | ICD-10-CM | POA: Diagnosis not present

## 2023-08-03 DIAGNOSIS — Z6835 Body mass index (BMI) 35.0-35.9, adult: Secondary | ICD-10-CM | POA: Diagnosis not present

## 2023-08-03 DIAGNOSIS — E1165 Type 2 diabetes mellitus with hyperglycemia: Secondary | ICD-10-CM | POA: Diagnosis not present

## 2023-08-03 DIAGNOSIS — E7800 Pure hypercholesterolemia, unspecified: Secondary | ICD-10-CM | POA: Diagnosis not present

## 2023-08-03 DIAGNOSIS — E119 Type 2 diabetes mellitus without complications: Secondary | ICD-10-CM | POA: Diagnosis not present

## 2023-10-04 DIAGNOSIS — H35363 Drusen (degenerative) of macula, bilateral: Secondary | ICD-10-CM | POA: Diagnosis not present

## 2023-10-04 DIAGNOSIS — E119 Type 2 diabetes mellitus without complications: Secondary | ICD-10-CM | POA: Diagnosis not present

## 2023-10-04 DIAGNOSIS — H2513 Age-related nuclear cataract, bilateral: Secondary | ICD-10-CM | POA: Diagnosis not present

## 2023-10-04 DIAGNOSIS — H5213 Myopia, bilateral: Secondary | ICD-10-CM | POA: Diagnosis not present

## 2024-01-05 DIAGNOSIS — M8580 Other specified disorders of bone density and structure, unspecified site: Secondary | ICD-10-CM | POA: Diagnosis not present

## 2024-01-05 DIAGNOSIS — E7800 Pure hypercholesterolemia, unspecified: Secondary | ICD-10-CM | POA: Diagnosis not present

## 2024-01-05 DIAGNOSIS — N183 Chronic kidney disease, stage 3 unspecified: Secondary | ICD-10-CM | POA: Diagnosis not present

## 2024-01-05 DIAGNOSIS — Z0000 Encounter for general adult medical examination without abnormal findings: Secondary | ICD-10-CM | POA: Diagnosis not present

## 2024-01-05 DIAGNOSIS — I10 Essential (primary) hypertension: Secondary | ICD-10-CM | POA: Diagnosis not present

## 2024-01-05 DIAGNOSIS — E1165 Type 2 diabetes mellitus with hyperglycemia: Secondary | ICD-10-CM | POA: Diagnosis not present

## 2024-01-05 DIAGNOSIS — E559 Vitamin D deficiency, unspecified: Secondary | ICD-10-CM | POA: Diagnosis not present

## 2024-01-05 DIAGNOSIS — E1122 Type 2 diabetes mellitus with diabetic chronic kidney disease: Secondary | ICD-10-CM | POA: Diagnosis not present

## 2024-02-03 NOTE — Progress Notes (Addendum)
 Julie Stout                                          MRN: [id number]   03/13/2024   The [facility] Quality Team Specialist reviewed this patient medical record for the purposes of chart review for care gap closure. The following were reviewed: chart review for care gap closure-kidney health evaluation for diabetes:eGFR  and uACR.    [facility] Quality Team
